# Patient Record
Sex: Male | Born: 1975 | Race: Asian | Hispanic: No | Marital: Married | Smoking: Current every day smoker
Health system: Southern US, Community
[De-identification: ages and names within clinical notes are randomized; demographics above are authoritative.]

## PROBLEM LIST (undated history)

## (undated) DIAGNOSIS — F32A Depression, unspecified: Secondary | ICD-10-CM

## (undated) DIAGNOSIS — I10 Essential (primary) hypertension: Secondary | ICD-10-CM

## (undated) DIAGNOSIS — Z8673 Personal history of transient ischemic attack (TIA), and cerebral infarction without residual deficits: Secondary | ICD-10-CM

## (undated) HISTORY — DX: Personal history of transient ischemic attack (TIA), and cerebral infarction without residual deficits: Z86.73

## (undated) HISTORY — DX: Depression, unspecified: F32.A

---

## 2010-05-29 ENCOUNTER — Emergency Department (HOSPITAL_COMMUNITY)
Admission: EM | Admit: 2010-05-29 | Discharge: 2010-05-30 | Disposition: A | Discharge status: Home / Self Care | Payer: Self-pay | Attending: Emergency Medicine | Admitting: Emergency Medicine

## 2010-05-29 DIAGNOSIS — K089 Disorder of teeth and supporting structures, unspecified: Secondary | ICD-10-CM | POA: Insufficient documentation

## 2010-05-29 DIAGNOSIS — I10 Essential (primary) hypertension: Secondary | ICD-10-CM | POA: Insufficient documentation

## 2010-05-30 LAB — POCT I-STAT, CHEM 8
Calcium, Ion: 1.05 mmol/L — ABNORMAL LOW (ref 1.12–1.32)
Chloride: 101 mEq/L (ref 96–112)
Creatinine, Ser: 1.2 mg/dL (ref 0.4–1.5)
Glucose, Bld: 87 mg/dL (ref 70–99)
Potassium: 3.6 mEq/L (ref 3.5–5.1)

## 2019-04-26 DIAGNOSIS — Z8673 Personal history of transient ischemic attack (TIA), and cerebral infarction without residual deficits: Secondary | ICD-10-CM

## 2019-04-26 HISTORY — DX: Personal history of transient ischemic attack (TIA), and cerebral infarction without residual deficits: Z86.73

## 2019-05-07 ENCOUNTER — Other Ambulatory Visit: Payer: Self-pay

## 2019-05-07 ENCOUNTER — Encounter (HOSPITAL_COMMUNITY): Payer: Self-pay

## 2019-05-07 ENCOUNTER — Emergency Department (HOSPITAL_COMMUNITY): Payer: Self-pay

## 2019-05-07 ENCOUNTER — Emergency Department (HOSPITAL_COMMUNITY)
Admission: EM | Admit: 2019-05-07 | Discharge: 2019-05-08 | Disposition: A | Discharge status: Home / Self Care | Payer: Self-pay | Attending: Emergency Medicine | Admitting: Emergency Medicine

## 2019-05-07 DIAGNOSIS — R200 Anesthesia of skin: Secondary | ICD-10-CM | POA: Insufficient documentation

## 2019-05-07 DIAGNOSIS — I10 Essential (primary) hypertension: Secondary | ICD-10-CM | POA: Insufficient documentation

## 2019-05-07 LAB — CBC
HCT: 54.3 % — ABNORMAL HIGH (ref 39.0–52.0)
Hemoglobin: 18.1 g/dL — ABNORMAL HIGH (ref 13.0–17.0)
MCH: 30.4 pg (ref 26.0–34.0)
MCHC: 33.3 g/dL (ref 30.0–36.0)
MCV: 91.1 fL (ref 80.0–100.0)
Platelets: 334 10*3/uL (ref 150–400)
RBC: 5.96 MIL/uL — ABNORMAL HIGH (ref 4.22–5.81)
RDW: 12.9 % (ref 11.5–15.5)
WBC: 9.8 10*3/uL (ref 4.0–10.5)
nRBC: 0 % (ref 0.0–0.2)

## 2019-05-07 LAB — BASIC METABOLIC PANEL
Anion gap: 10 (ref 5–15)
BUN: 13 mg/dL (ref 6–20)
CO2: 26 mmol/L (ref 22–32)
Calcium: 9.3 mg/dL (ref 8.9–10.3)
Chloride: 103 mmol/L (ref 98–111)
Creatinine, Ser: 0.81 mg/dL (ref 0.61–1.24)
GFR calc Af Amer: >60 mL/min (ref >60)
GFR calc non Af Amer: >60 mL/min (ref >60)
Glucose, Bld: 105 mg/dL — ABNORMAL HIGH (ref 70–99)
Potassium: 3.8 mmol/L (ref 3.5–5.1)
Sodium: 139 mmol/L (ref 135–145)

## 2019-05-07 LAB — TROPONIN I (HIGH SENSITIVITY): Troponin I (High Sensitivity): 17 ng/L (ref <18)

## 2019-05-07 MED ORDER — LABETALOL HCL 5 MG/ML IV SOLN
20.0000 mg | Freq: Once | INTRAVENOUS | Status: AC
  Administered 2019-05-07: 23:00:00 10 mg via INTRAVENOUS
  Filled 2019-05-07: qty 4

## 2019-05-07 MED ORDER — ACETAMINOPHEN 500 MG PO TABS
1000.0000 mg | ORAL_TABLET | Freq: Once | ORAL | Status: AC
  Administered 2019-05-07: 23:00:00 1000 mg via ORAL
  Filled 2019-05-07: qty 2

## 2019-05-07 NOTE — Discharge Instructions (Addendum)
There is an area seen on your chest x-ray that is not well identified. Recommend a chest CT in the outpatient setting for further evaluation. This is not likely contributing to your elevated blood pressure.   The Head CT does not show any new findings but does show old infarcts. Recommend an MRI in the outpatient setting. Please discuss this with your primary care doctor.   Take Norvasc every morning for treatment of blood pressure. Measure your blood pressure in the morning and at night until your appointment with primary care and keep a journal of measurements to take with you to that appointment.   If you develop any new symptoms of concern, please do not hesitate to return to the emergency department for further evaluation.

## 2019-05-07 NOTE — ED Triage Notes (Signed)
Patient arrived with complaints of hypertension. Reporting his blood pressure has been high a few days. Also stating he has some numbness in his right arm and leg that has been present for a few weeks.

## 2019-05-07 NOTE — ED Notes (Signed)
Other 10mg  given as directed by PA Upstill.

## 2019-05-07 NOTE — ED Notes (Signed)
PT transported to CT>

## 2019-05-07 NOTE — ED Notes (Signed)
Patient called for EKG with no answer from lobby.

## 2019-05-07 NOTE — ED Provider Notes (Signed)
[facility] DEPT Provider Note   CSN: [id number] Arrival date & time: 05/07/19  1854     History Chief Complaint  Patient presents with  . Hypertension    Victor Davies is a 44 y.o. male.  Patient to ED with concern for elevated blood pressure. He reports taking his measurements at home for the past 2-3 days and found it high. Prior to this, he cannot remember the last time it was measured. He reports a decreased sensation to right arm and leg for the past 2 weeks. No trouble walking. He reports writing is difficult (he is right hand dominant). No chest pain, breathing problems, nausea. He currently has a headache described as bilateral, frontal, throbbing. No visual changes. He does not know if his parents had HTN. He is a smoker.   The history is provided by the patient. No language interpreter was used.  Hypertension Associated symptoms include headaches. Pertinent negatives include no chest pain, no abdominal pain and no shortness of breath.       History reviewed. No pertinent past medical history.  There are no problems to display for this patient.   History reviewed. No pertinent surgical history.     No family history on file.  Social History   Tobacco Use  . Smoking status: Not on file  Substance Use Topics  . Alcohol use: Not on file  . Drug use: Not on file    Home Medications Prior to Admission medications   Not on File    Allergies    Patient has no known allergies.  Review of Systems   Review of Systems  Constitutional: Negative for chills and fever.  HENT: Negative.   Eyes: Negative for visual disturbance.  Respiratory: Negative.  Negative for shortness of breath.   Cardiovascular: Negative.  Negative for chest pain and leg swelling.  Gastrointestinal: Negative.  Negative for abdominal pain, nausea and vomiting.  Musculoskeletal: Negative.   Skin: Negative.   Allergic/Immunologic: Negative for  immunocompromised state.  Neurological: Positive for numbness and headaches. Negative for speech difficulty and light-headedness.    Physical Exam Updated Vital Signs BP (!) 236/144 (BP Location: Left Arm)   Pulse 81   Temp 98.6 F (37 C) (Oral)   Resp 20   Ht 5\' 9"  (1.753 m)   Wt 69.4 kg   SpO2 100%   BMI 22.59 kg/m   Physical Exam Vitals and nursing note reviewed.  Constitutional:      Appearance: Normal appearance. He is well-developed. He is not ill-appearing.     Comments: Appears anxious.  HENT:     Head: Normocephalic.  Eyes:     General: No visual field deficit.    Extraocular Movements: Extraocular movements intact.     Pupils: Pupils are equal, round, and reactive to light.  Neck:     Vascular: No carotid bruit.  Cardiovascular:     Rate and Rhythm: Normal rate and regular rhythm.     Heart sounds: No murmur.  Pulmonary:     Effort: Pulmonary effort is normal.     Breath sounds: Normal breath sounds. No wheezing, rhonchi or rales.  Abdominal:     General: Bowel sounds are normal.     Palpations: Abdomen is soft.     Tenderness: There is no abdominal tenderness. There is no guarding or rebound.  Musculoskeletal:        General: Normal range of motion.     Cervical back: Normal range of motion and  neck supple.  Skin:    General: Skin is warm and dry.     Findings: No rash.  Neurological:     General: No focal deficit present.     Mental Status: He is alert.     GCS: GCS eye subscore is 4. GCS verbal subscore is 5. GCS motor subscore is 6.     Cranial Nerves: No dysarthria or facial asymmetry.     Sensory: Sensation is intact. No sensory deficit.     Motor: No weakness or pronator drift.     Coordination: Finger-Nose-Finger Test normal.     Gait: Gait is intact.     Comments: CN's 3-12 intact.     ED Results / Procedures / Treatments   Labs (all labs ordered are listed, but only abnormal results are displayed) Labs Reviewed  CBC - Abnormal;  Notable for the following components:      Result Value   RBC 5.96 (*)    Hemoglobin 18.1 (*)    HCT 54.3 (*)    All other components within normal limits  BASIC METABOLIC PANEL  TROPONIN I (HIGH SENSITIVITY)    EKG None  Radiology DG Chest 2 View  Result Date: 05/07/2019 CLINICAL DATA:  Chest numbness EXAM: CHEST - 2 VIEW COMPARISON:  None. FINDINGS: No acute consolidation or effusion. Oval opacity projecting over the spine on lateral view. Heart size upper normal. No pneumothorax. IMPRESSION: No active cardiopulmonary disease. Possible oval opacity projecting over the spine on lateral view. Lung parenchymal abnormality cannot be excluded. Chest CT is suggested for further evaluation. Electronically Signed   By: Jasmine Pang M.D.   On: 05/07/2019 21:45    Procedures Procedures (including critical care time)  Medications Ordered in ED Medications  acetaminophen (TYLENOL) tablet 1,000 mg (has no administration in time range)  labetalol (NORMODYNE) injection 20 mg (has no administration in time range)    ED Course  I have reviewed the triage vital signs and the nursing notes.  Pertinent labs & imaging results that were available during my care of the patient were reviewed by me and considered in my medical decision making (see chart for details).    MDM Rules/Calculators/A&P                      Patient to ED with significantly elevated blood pressure. No CP, SOB, nausea. Does c/o right extremity numbness but no deficits on neuro exam.   Labs pending. Will obtain CT. CXR w/o infiltrates. There is an opacity in the right next to the spine of questionable significance. Recommendation is for outpatient chest CT to clarify. This was discussed with the patient and wife.   11:10 - Blood pressure 192/128 after Labetalol 10 mg. Will give additional 10 mg.  1:00 - 189/122 - CT shows old infarcts but no acute changes. Recommend MRI which can be done in the outpatient setting. This was  discussed with the patient and wife.   Patient care planning discussed with Dr. Judd Lien. He can be discharged home with referral for primary care provider. Will start on Norvasc 10 mg daily. The patient and wife are comfortable with plan of discharge.   Final Clinical Impression(s) / ED Diagnoses Final diagnoses:  None   1. Hypertension  Rx / DC Orders ED Discharge Orders    None       Elpidio Anis, PA-C 05/08/19 0125    Geoffery Lyons, MD 05/08/19 1504

## 2019-05-08 LAB — TROPONIN I (HIGH SENSITIVITY): Troponin I (High Sensitivity): 19 ng/L — ABNORMAL HIGH (ref <18)

## 2019-05-08 MED ORDER — AMLODIPINE BESYLATE 10 MG PO TABS: 10.0000 mg | ORAL_TABLET | Freq: Every day | ORAL | 0 refills | Status: DC

## 2019-05-08 NOTE — ED Notes (Signed)
PT educated on importance of taking blood pressure medications and follow up CT and MRI. PT verbalized understanding. PT educated to return if exprince dizziness or blurred vision.

## 2019-05-10 ENCOUNTER — Other Ambulatory Visit: Payer: Self-pay | Admitting: Internal Medicine

## 2019-05-10 ENCOUNTER — Encounter: Payer: Self-pay | Admitting: Internal Medicine

## 2019-05-10 ENCOUNTER — Other Ambulatory Visit: Payer: Self-pay

## 2019-05-10 ENCOUNTER — Ambulatory Visit: Payer: Self-pay | Attending: Internal Medicine | Admitting: Internal Medicine

## 2019-05-10 VITALS — BP 171/110 | HR 81 | Temp 97.9°F | Resp 16 | Ht 69.0 in | Wt 162.4 lb

## 2019-05-10 DIAGNOSIS — D751 Secondary polycythemia: Secondary | ICD-10-CM

## 2019-05-10 DIAGNOSIS — F17200 Nicotine dependence, unspecified, uncomplicated: Secondary | ICD-10-CM

## 2019-05-10 DIAGNOSIS — R209 Unspecified disturbances of skin sensation: Secondary | ICD-10-CM

## 2019-05-10 DIAGNOSIS — I69398 Other sequelae of cerebral infarction: Secondary | ICD-10-CM

## 2019-05-10 DIAGNOSIS — I10 Essential (primary) hypertension: Secondary | ICD-10-CM

## 2019-05-10 DIAGNOSIS — R918 Other nonspecific abnormal finding of lung field: Secondary | ICD-10-CM

## 2019-05-10 MED ORDER — VARENICLINE TARTRATE 0.5 MG PO TABS: 0.5000 mg | ORAL_TABLET | Freq: Two times a day (BID) | ORAL | 0 refills | Status: DC

## 2019-05-10 MED ORDER — ASPIRIN EC 81 MG PO TBEC: 81.0000 mg | DELAYED_RELEASE_TABLET | Freq: Every day | ORAL | 0 refills | Status: DC

## 2019-05-10 MED ORDER — LISINOPRIL 10 MG PO TABS: 10.0000 mg | ORAL_TABLET | Freq: Every day | ORAL | 3 refills | Status: DC

## 2019-05-10 MED ORDER — AMLODIPINE BESYLATE 10 MG PO TABS: 10.0000 mg | ORAL_TABLET | Freq: Every day | ORAL | 3 refills | Status: DC

## 2019-05-10 MED FILL — AMLODIPINE BESYLATE 10 MG T: 10 | 30 days supply | Qty: 30 | Fill #0

## 2019-05-10 MED FILL — LISINOPRIL 10 MG TABS: 10 | 30 days supply | Qty: 30 | Fill #0

## 2019-05-10 NOTE — Progress Notes (Signed)
Patient ID: Victor Davies, male    DOB: 1975/12/30  MRN: [id number]  CC: Hospitalization Follow-up (ED)   Subjective: Victor Davies is a 44 y.o. male who presents for new pt visit and ER f/u.  Pt orginally from [place]. His concerns today include:  HTN  No previous PCP.   No previous chronic med issues until he did home BP readings several days ago. Marland Kitchen  NSAIDs follow-up from the emergency room where he was seen 3 days ago with significant elevation in both systolic and diastolic blood pressure.  He was checking blood pressure at home 2 to 3 days prior and found that it was elevated.  He also complained of decreased sensation in the right upper and lower extremity x2 weeks.  In the ER blood pressure was 236/144.  Neurologic exam is intact.  Chemistry revealed elevated hemoglobin on current hematocrit.  CT of the head revealed chronic small vessel disease greater than expected for age with multiple old small vessel infarcts.  MRI was recommended for further characterization.  Chest x-ray revealed a possible oval opacity projecting over the spine on lateral view.  Chest CT was recommended.  Patient was discharged home on Norvasc 10 mg daily.  Started Norvasc.  BP last night was 170/70-80.  Today he vomited after taking his Norvasc but he did not notice the pill in his emesis. No HA/dizziness, CP, SOB, LE edema Still having numbness in arm and leg x 1 mth.  Sometimes he feels the left leg is weak.   He smokes about a third a pack a day.  He has smoked for 20 years.  Reports chronic dry cough x2 years.  Past medical, social, family history reviewed and updated in the system.  No current outpatient medications on file prior to visit.   No current facility-administered medications on file prior to visit.    No Known Allergies  Social History   Socioeconomic History  . Marital status: Single    Spouse name: Not on file  . Number of children: 2  . Years of education: Not on file    . Highest education level: 9th grade  Occupational History  . Occupation: unemployed  Tobacco Use  . Smoking status: Current Some Day Smoker    Packs/day: 0.25    Years: 20.00    Pack years: 5.00  . Smokeless tobacco: Never Used  Substance and Sexual Activity  . Alcohol use: Yes    Alcohol/week: 14.0 - 21.0 standard drinks    Types: 14 - 21 Cans of beer per week  . Drug use: Not on file  . Sexual activity: Not on file  Other Topics Concern  . Not on file  Social History Narrative  . Not on file   Social Determinants of Health   Financial Resource Strain:   . Difficulty of Paying Living Expenses:   Food Insecurity:   . Worried About Programme researcher, broadcasting/film/video in the Last Year:   . Barista in the Last Year:   Transportation Needs:   . Freight forwarder (Medical):   Marland Kitchen Lack of Transportation (Non-Medical):   Physical Activity:   . Days of Exercise per Week:   . Minutes of Exercise per Session:   Stress:   . Feeling of Stress :   Social Connections:   . Frequency of Communication with Friends and Family:   . Frequency of Social Gatherings with Friends and Family:   . Attends Religious Services:   .  Active Member of Clubs or Organizations:   . Attends Banker Meetings:   Marland Kitchen Marital Status:   Intimate Partner Violence:   . Fear of Current or Ex-Partner:   . Emotionally Abused:   Marland Kitchen Physically Abused:   . Sexually Abused:     Family History  Problem Relation Age of Onset  . Hypertension Father     No past surgical history on file.  ROS: Review of Systems Negative except as stated above  PHYSICAL EXAM: BP (!) 171/110   Pulse 81   Temp 97.9 F (36.6 C)   Resp 16   Ht 5\' 9"  (1.753 m)   Wt 162 lb 6.4 oz (73.7 kg)   SpO2 98%   BMI 23.98 kg/m   BP 170/120 Physical Exam  General appearance - alert, well appearing, middle-age male and in no distress Mental status - alert, oriented to person, place, and time, normal mood, behavior, speech,  dress, motor activity, and thought processes Eyes - pupils equal and reactive, extraocular eye movements intact Mouth - mucous membranes moist, pharynx normal without lesions Neck - supple, no significant adenopathy Chest - clear to auscultation, no wheezes, rales or rhonchi, symmetric air entry Heart - normal rate, regular rhythm, normal S1, S2, no murmurs, rubs, clicks or gallops Neurological - cranial nerves II through XII intact, normal muscle tone, no tremors, strength 5/5.  Decreased gross sensation in both the right upper and lower extremity.  Gait is okay Extremities -no lower extremity edema CMP Latest Ref Rng & Units 05/07/2019 05/30/2010  Glucose 70 - 99 mg/dL 07/30/2010) 87  BUN 6 - 20 mg/dL 13 7  Creatinine 782(N - 1.24 mg/dL 5.62 1.30  Sodium 8.65 - 145 mmol/L 139 135  Potassium 3.5 - 5.1 mmol/L 3.8 3.6  Chloride 98 - 111 mmol/L 103 101  CO2 22 - 32 mmol/L 26 -  Calcium 8.9 - 10.3 mg/dL 9.3 -   Lipid Panel  No results found for: CHOL, TRIG, HDL, CHOLHDL, VLDL, LDLCALC, LDLDIRECT  CBC    Component Value Date/Time   WBC 9.8 05/07/2019 2211   RBC 5.96 (H) 05/07/2019 2211   HGB 18.1 (H) 05/07/2019 2211   HCT 54.3 (H) 05/07/2019 2211   PLT 334 05/07/2019 2211   MCV 91.1 05/07/2019 2211   MCH 30.4 05/07/2019 2211   MCHC 33.3 05/07/2019 2211   RDW 12.9 05/07/2019 2211    ASSESSMENT AND PLAN: 1. Essential hypertension Not at goal.  Add lisinopril 10 mg.  Continue Norvasc.  Encourage low salt intake. - Hepatic Function Panel - lisinopril (ZESTRIL) 10 MG tablet; Take 1 tablet (10 mg total) by mouth daily.  Dispense: 30 tablet; Refill: 3 - amLODipine (NORVASC) 10 MG tablet; Take 1 tablet (10 mg total) by mouth daily.  Dispense: 30 tablet; Refill: 3  2. Tobacco dependence Strongly advised to discontinue smoking.  Discussed health risks associated with smoking.  He would like to quit.  Discussed methods to help him quit.  He would like to try the Chantix.  I went over possible side  effects of the medication including mood swings and bad dreams. - varenicline (CHANTIX) 0.5 MG tablet; Take 1 tablet (0.5 mg total) by mouth 2 (two) times daily. For smoking cessation  Dispense: 30 tablet; Refill: 0  3. CVA, old, alterations of sensations Looks like he has small old infarcts on CAT scan.  Discussed with patient the importance of secondary prevention including good blood pressure control, smoking cessation and control of cholesterol.  We will check a lipid profile today.  Add low-dose aspirin. -He will need further work-up including an MRI of the head, carotid ultrasound and echo.  He is uninsured.  I have told him that we can order these things for which he would end up having to pay out-of-pocket or he can try applying for the orange card/cone discount card as quickly as possible.  He prefers to do the latter. - Lipid panel - aspirin EC 81 MG tablet; Take 1 tablet (81 mg total) by mouth daily.  Dispense: 100 tablet; Refill: 0  4. Opacity of lung on imaging study He will need a CAT scan of the chest to evaluate.  We will order once he has the orange card/cone discount.  Advised to quit smoking.  5. Polycythemia Likely due to smoking.  I forgot to ask him about any symptoms of sleep apnea.  I will do that on his next visit.  We can also check erythropoietin level    Patient was given the opportunity to ask questions.  Patient verbalized understanding of the plan and was able to repeat key elements of the plan.   Orders Placed This Encounter  Procedures  . Lipid panel  . Hepatic Function Panel     Requested Prescriptions   Signed Prescriptions Disp Refills  . lisinopril (ZESTRIL) 10 MG tablet 30 tablet 3    Sig: Take 1 tablet (10 mg total) by mouth daily.  Marland Kitchen amLODipine (NORVASC) 10 MG tablet 30 tablet 3    Sig: Take 1 tablet (10 mg total) by mouth daily.  . varenicline (CHANTIX) 0.5 MG tablet 30 tablet 0    Sig: Take 1 tablet (0.5 mg total) by mouth 2 (two) times  daily. For smoking cessation  . aspirin EC 81 MG tablet 100 tablet 0    Sig: Take 1 tablet (81 mg total) by mouth daily.    Return in about 1 week (around 05/17/2019).  Karle Plumber, MD, FACP

## 2019-05-10 NOTE — Progress Notes (Signed)
Pt states he took his bp medicine this morning and threw up  Pt states his right arm and hand is numb

## 2019-05-10 NOTE — Patient Instructions (Addendum)
Please get form for the orange card/cone discount card today on your way out.  Please fill those out and bring the supporting documents with the completed form so that we can schedule an appointment for you to see our financial specialist.  Continue amlodipine for blood pressure. We have added another blood pressure medicine called lisinopril 10 mg daily.  You need to start taking a baby aspirin 81 mg daily to help prevent future episodes of stroke.  Please work on trying to quit smoking.  We have started you on a medication called Chantix to assist with this. Try to cut back on the amount of beers that you drink to no more than 1 a day.

## 2019-05-11 ENCOUNTER — Telehealth: Payer: Self-pay

## 2019-05-11 ENCOUNTER — Other Ambulatory Visit: Payer: Self-pay | Admitting: Internal Medicine

## 2019-05-11 LAB — HEPATIC FUNCTION PANEL
ALT: 14 IU/L (ref 0–44)
AST: 21 IU/L (ref 0–40)
Albumin: 4.8 g/dL (ref 4.0–5.0)
Alkaline Phosphatase: 81 IU/L (ref 39–117)
Bilirubin Total: 0.8 mg/dL (ref 0.0–1.2)
Bilirubin, Direct: 0.2 mg/dL (ref 0.00–0.40)
Total Protein: 7.6 g/dL (ref 6.0–8.5)

## 2019-05-11 LAB — LIPID PANEL
Chol/HDL Ratio: 3.6 ratio (ref 0.0–5.0)
Cholesterol, Total: 247 mg/dL — ABNORMAL HIGH (ref 100–199)
HDL: 69 mg/dL (ref >39)
LDL Chol Calc (NIH): 166 mg/dL — ABNORMAL HIGH (ref 0–99)
Triglycerides: 72 mg/dL (ref 0–149)
VLDL Cholesterol Cal: 12 mg/dL (ref 5–40)

## 2019-05-11 MED ORDER — ATORVASTATIN CALCIUM 20 MG PO TABS: 20.0000 mg | ORAL_TABLET | Freq: Every day | ORAL | 3 refills | Status: DC

## 2019-05-11 MED FILL — ATORVASTATIN CALCIUM 20 MG: 20 | 30 days supply | Qty: 30 | Fill #0

## 2019-05-11 NOTE — Telephone Encounter (Signed)
Contacted pt to go over lab results pt is aware and doesn't have any questions or concerns 

## 2019-05-11 NOTE — Progress Notes (Signed)
Let patient know that his cholesterol is elevated.  I recommend starting a medication to help lower cholesterol called atorvastatin.  I have sent this prescription to our pharmacy.  Liver function tests normal.

## 2019-05-16 ENCOUNTER — Emergency Department (HOSPITAL_COMMUNITY): Payer: Medicaid Other

## 2019-05-16 ENCOUNTER — Other Ambulatory Visit: Payer: Self-pay

## 2019-05-16 ENCOUNTER — Encounter: Payer: Self-pay | Admitting: Internal Medicine

## 2019-05-16 ENCOUNTER — Encounter (HOSPITAL_COMMUNITY): Payer: Self-pay | Admitting: Emergency Medicine

## 2019-05-16 ENCOUNTER — Inpatient Hospital Stay (HOSPITAL_COMMUNITY)
Admission: EM | Admit: 2019-05-16 | Discharge: 2019-05-19 | DRG: 065 | Disposition: A | Discharge status: Home / Self Care | Payer: Medicaid Other | Attending: Internal Medicine | Admitting: Internal Medicine

## 2019-05-16 DIAGNOSIS — I10 Essential (primary) hypertension: Secondary | ICD-10-CM | POA: Diagnosis present

## 2019-05-16 DIAGNOSIS — E876 Hypokalemia: Secondary | ICD-10-CM

## 2019-05-16 DIAGNOSIS — R918 Other nonspecific abnormal finding of lung field: Secondary | ICD-10-CM | POA: Diagnosis present

## 2019-05-16 DIAGNOSIS — Z720 Tobacco use: Secondary | ICD-10-CM

## 2019-05-16 DIAGNOSIS — I161 Hypertensive emergency: Secondary | ICD-10-CM | POA: Diagnosis present

## 2019-05-16 DIAGNOSIS — R9431 Abnormal electrocardiogram [ECG] [EKG]: Secondary | ICD-10-CM | POA: Diagnosis not present

## 2019-05-16 DIAGNOSIS — F1021 Alcohol dependence, in remission: Secondary | ICD-10-CM

## 2019-05-16 DIAGNOSIS — I2510 Atherosclerotic heart disease of native coronary artery without angina pectoris: Secondary | ICD-10-CM | POA: Diagnosis present

## 2019-05-16 DIAGNOSIS — E785 Hyperlipidemia, unspecified: Secondary | ICD-10-CM | POA: Diagnosis present

## 2019-05-16 DIAGNOSIS — F1210 Cannabis abuse, uncomplicated: Secondary | ICD-10-CM

## 2019-05-16 DIAGNOSIS — I6381 Other cerebral infarction due to occlusion or stenosis of small artery: Principal | ICD-10-CM | POA: Diagnosis present

## 2019-05-16 DIAGNOSIS — Z8261 Family history of arthritis: Secondary | ICD-10-CM

## 2019-05-16 DIAGNOSIS — R29702 NIHSS score 2: Secondary | ICD-10-CM | POA: Diagnosis present

## 2019-05-16 DIAGNOSIS — Z7982 Long term (current) use of aspirin: Secondary | ICD-10-CM

## 2019-05-16 DIAGNOSIS — I639 Cerebral infarction, unspecified: Secondary | ICD-10-CM | POA: Diagnosis present

## 2019-05-16 DIAGNOSIS — R42 Dizziness and giddiness: Secondary | ICD-10-CM

## 2019-05-16 DIAGNOSIS — R202 Paresthesia of skin: Secondary | ICD-10-CM | POA: Diagnosis present

## 2019-05-16 DIAGNOSIS — R269 Unspecified abnormalities of gait and mobility: Secondary | ICD-10-CM | POA: Diagnosis present

## 2019-05-16 DIAGNOSIS — F1010 Alcohol abuse, uncomplicated: Secondary | ICD-10-CM | POA: Diagnosis present

## 2019-05-16 DIAGNOSIS — Z87891 Personal history of nicotine dependence: Secondary | ICD-10-CM

## 2019-05-16 DIAGNOSIS — Z20822 Contact with and (suspected) exposure to covid-19: Secondary | ICD-10-CM | POA: Diagnosis present

## 2019-05-16 DIAGNOSIS — E7800 Pure hypercholesterolemia, unspecified: Secondary | ICD-10-CM | POA: Diagnosis present

## 2019-05-16 DIAGNOSIS — R112 Nausea with vomiting, unspecified: Secondary | ICD-10-CM | POA: Diagnosis present

## 2019-05-16 DIAGNOSIS — Z8249 Family history of ischemic heart disease and other diseases of the circulatory system: Secondary | ICD-10-CM

## 2019-05-16 DIAGNOSIS — Z79899 Other long term (current) drug therapy: Secondary | ICD-10-CM

## 2019-05-16 DIAGNOSIS — R911 Solitary pulmonary nodule: Secondary | ICD-10-CM | POA: Diagnosis present

## 2019-05-16 HISTORY — DX: Essential (primary) hypertension: I10

## 2019-05-16 LAB — COMPREHENSIVE METABOLIC PANEL
ALT: 22 U/L (ref 0–44)
AST: 19 U/L (ref 15–41)
Albumin: 4.3 g/dL (ref 3.5–5.0)
Alkaline Phosphatase: 67 U/L (ref 38–126)
Anion gap: 11 (ref 5–15)
BUN: 13 mg/dL (ref 6–20)
CO2: 26 mmol/L (ref 22–32)
Calcium: 9.3 mg/dL (ref 8.9–10.3)
Chloride: 103 mmol/L (ref 98–111)
Creatinine, Ser: 0.85 mg/dL (ref 0.61–1.24)
GFR calc Af Amer: >60 mL/min (ref >60)
GFR calc non Af Amer: >60 mL/min (ref >60)
Glucose, Bld: 116 mg/dL — ABNORMAL HIGH (ref 70–99)
Potassium: 4.2 mmol/L (ref 3.5–5.1)
Sodium: 140 mmol/L (ref 135–145)
Total Bilirubin: 1 mg/dL (ref 0.3–1.2)
Total Protein: 8 g/dL (ref 6.5–8.1)

## 2019-05-16 LAB — CBC
HCT: 53.2 % — ABNORMAL HIGH (ref 39.0–52.0)
Hemoglobin: 17.5 g/dL — ABNORMAL HIGH (ref 13.0–17.0)
MCH: 30.7 pg (ref 26.0–34.0)
MCHC: 32.9 g/dL (ref 30.0–36.0)
MCV: 93.3 fL (ref 80.0–100.0)
Platelets: 342 10*3/uL (ref 150–400)
RBC: 5.7 MIL/uL (ref 4.22–5.81)
RDW: 12.8 % (ref 11.5–15.5)
WBC: 10.6 10*3/uL — ABNORMAL HIGH (ref 4.0–10.5)
nRBC: 0 % (ref 0.0–0.2)

## 2019-05-16 LAB — URINALYSIS, ROUTINE W REFLEX MICROSCOPIC
Bilirubin Urine: NEGATIVE
Glucose, UA: 150 mg/dL — AB
Hgb urine dipstick: NEGATIVE
Ketones, ur: 20 mg/dL — AB
Leukocytes,Ua: NEGATIVE
Nitrite: NEGATIVE
Protein, ur: NEGATIVE mg/dL
Specific Gravity, Urine: 1.015 (ref 1.005–1.030)
pH: 6 (ref 5.0–8.0)

## 2019-05-16 LAB — LIPASE, BLOOD: Lipase: 22 U/L (ref 11–51)

## 2019-05-16 MED ORDER — SODIUM CHLORIDE 0.9% FLUSH
3.0000 mL | Freq: Once | INTRAVENOUS | Status: AC
  Administered 2019-05-16: 3 mL via INTRAVENOUS

## 2019-05-16 MED ORDER — PROCHLORPERAZINE EDISYLATE 10 MG/2ML IJ SOLN
10.0000 mg | Freq: Once | INTRAMUSCULAR | Status: AC
  Administered 2019-05-16: 10 mg via INTRAVENOUS
  Filled 2019-05-16: qty 2

## 2019-05-16 MED ORDER — SODIUM CHLORIDE 0.9 % IV BOLUS
1000.0000 mL | Freq: Once | INTRAVENOUS | Status: AC
  Administered 2019-05-16: 1000 mL via INTRAVENOUS

## 2019-05-16 MED ORDER — MECLIZINE HCL 25 MG PO TABS
25.0000 mg | ORAL_TABLET | Freq: Once | ORAL | Status: AC
  Administered 2019-05-16: 21:00:00 25 mg via ORAL
  Filled 2019-05-16: qty 1

## 2019-05-16 NOTE — ED Notes (Signed)
Patient made aware urine sample is needed x 2.

## 2019-05-16 NOTE — ED Notes (Signed)
Pt given a urinal and denies being able to give urine sample at this time

## 2019-05-16 NOTE — ED Triage Notes (Signed)
Pt c/o n/v and dizziness that started this morning.

## 2019-05-16 NOTE — ED Notes (Signed)
Patient transported to MRI 

## 2019-05-16 NOTE — ED Provider Notes (Signed)
[facility]-EMERGENCY DEPT Provider Note   CSN: [id number] Arrival date & time: 05/16/19  1759     History Chief Complaint  Patient presents with  . Dizziness  . Emesis    Victor Davies is a 44 y.o. male.  The history is provided by the patient.  Dizziness Quality:  Lightheadedness and imbalance Severity:  Moderate Onset quality:  Gradual Timing:  Constant Progression:  Unchanged Chronicity:  New Context: head movement and standing up   Relieved by:  Being still Worsened by:  Movement Associated symptoms: vomiting   Associated symptoms: no blood in stool, no chest pain, no diarrhea, no headaches, no hearing loss, no nausea, no palpitations, no shortness of breath, no syncope, no tinnitus, no vision changes and no weakness   Risk factors: no hx of stroke and no hx of vertigo        History reviewed. No pertinent past medical history.  Patient Active Problem List   Diagnosis Date Noted  . Essential hypertension 05/10/2019  . Tobacco dependence 05/10/2019  . 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 Opacity of lung on imaging study 05/10/2019  . Polycythemia 05/10/2019    History reviewed. No pertinent surgical history.     Family History  Problem Relation Age of Onset  . Hypertension Father     Social History   Tobacco Use  . Smoking status: Current Some Day Smoker    Packs/day: 0.25    Years: 20.00    Pack years: 5.00  . Smokeless tobacco: Never Used  Substance Use Topics  . Alcohol use: Yes    Alcohol/week: 14.0 - 21.0 standard drinks    Types: 14 - 21 Cans of beer per week  . Drug use: Not on file    Home Medications Prior to Admission medications   Medication Sig Start Date End Date Taking? Authorizing Provider  amLODipine (NORVASC) 10 MG tablet Take 1 tablet (10 mg total) by mouth daily. 05/10/19  Yes Marcine Matar, MD  aspirin EC 81 MG tablet Take 1 tablet (81 mg total) by mouth daily. 05/10/19  Yes  Marcine Matar, MD  atorvastatin (LIPITOR) 20 MG tablet Take 1 tablet (20 mg total) by mouth daily. 05/11/19  Yes Marcine Matar, MD  lisinopril (ZESTRIL) 10 MG tablet Take 1 tablet (10 mg total) by mouth daily. 05/10/19  Yes Marcine Matar, MD  varenicline (CHANTIX) 0.5 MG tablet Take 1 tablet (0.5 mg total) by mouth 2 (two) times daily. For smoking cessation 05/10/19   Marcine Matar, MD    Allergies    Patient has no known allergies.  Review of Systems   Review of Systems  Constitutional: Negative for chills and fever.  HENT: Negative for ear pain, hearing loss, sore throat and tinnitus.   Eyes: Negative for pain and visual disturbance.  Respiratory: Negative for cough and shortness of breath.   Cardiovascular: Negative for chest pain, palpitations and syncope.  Gastrointestinal: Positive for vomiting. Negative for abdominal pain, blood in stool, diarrhea and nausea.  Genitourinary: Negative for dysuria and hematuria.  Musculoskeletal: Negative for arthralgias and back pain.  Skin: Negative for color change and rash.  Neurological: Positive for dizziness. Negative for seizures, syncope, weakness and headaches.  All other systems reviewed and are negative.   Physical Exam Updated Vital Signs  ED Triage Vitals  Enc Vitals Group     BP 05/16/19 1813 (!) 174/111     Pulse Rate 05/16/19 1813 72  Resp 05/16/19 1813 17     Temp 05/16/19 1813 97.6 F (36.4 C)     Temp Source 05/16/19 1813 Oral     SpO2 05/16/19 1813 99 %     Weight --      Height --      Head Circumference --      Peak Flow --      Pain Score 05/16/19 1811 0     Pain Loc --      Pain Edu? --      Excl. in GC? --     Physical Exam Vitals and nursing note reviewed.  Constitutional:      General: He is not in acute distress.    Appearance: He is well-developed. He is not ill-appearing.  HENT:     Head: Normocephalic and atraumatic.     Mouth/Throat:     Mouth: Mucous membranes are dry.    Eyes:     Extraocular Movements: Extraocular movements intact.     Conjunctiva/sclera: Conjunctivae normal.     Pupils: Pupils are equal, round, and reactive to light.  Cardiovascular:     Rate and Rhythm: Normal rate and regular rhythm.     Pulses: Normal pulses.     Heart sounds: Normal heart sounds. No murmur.  Pulmonary:     Effort: Pulmonary effort is normal. No respiratory distress.     Breath sounds: Normal breath sounds.  Abdominal:     Palpations: Abdomen is soft.     Tenderness: There is no abdominal tenderness.  Musculoskeletal:     Cervical back: Neck supple.  Skin:    General: Skin is warm and dry.  Neurological:     General: No focal deficit present.     Mental Status: He is alert and oriented to person, place, and time.     Cranial Nerves: No cranial nerve deficit.     Sensory: No sensory deficit.     Motor: No weakness.     Coordination: Coordination normal.     Gait: Gait abnormal.     Comments: Patient unable to walk due to severe symptoms, otherwise 5+ out of 5 strength throughout, normal sensation, no drift, normal finger-nose-finger     ED Results / Procedures / Treatments   Labs (all labs ordered are listed, but only abnormal results are displayed) Labs Reviewed  COMPREHENSIVE METABOLIC PANEL - Abnormal; Notable for the following components:      Result Value   Glucose, Bld 116 (*)    All other components within normal limits  CBC - Abnormal; Notable for the following components:   WBC 10.6 (*)    Hemoglobin 17.5 (*)    HCT 53.2 (*)    All other components within normal limits  LIPASE, BLOOD  URINALYSIS, ROUTINE W REFLEX MICROSCOPIC    EKG None  Radiology No results found.  Procedures Procedures (including critical care time)  Medications Ordered in ED Medications  sodium chloride flush (NS) 0.9 % injection 3 mL (3 mLs Intravenous Given 05/16/19 2002)  meclizine (ANTIVERT) tablet 25 mg (25 mg Oral Given 05/16/19 2045)  sodium chloride  0.9 % bolus 1,000 mL (1,000 mLs Intravenous New Bag/Given 05/16/19 2045)  prochlorperazine (COMPAZINE) injection 10 mg (10 mg Intravenous Given 05/16/19 2044)    ED Course  I have reviewed the triage vital signs and the nursing notes.  Pertinent labs & imaging results that were available during my care of the patient were reviewed by me and considered in my medical  decision making (see chart for details).    MDM Rules/Calculators/A&P                       Venkat Canino is a 44 year old male with history of hypertension who presents to the ED with dizziness, emesis.  Patient with mild hypertension upon arrival but otherwise normal vitals.  Symptoms started today and have been progressive.  Has fairly persistent dizziness with nausea.  Had recent work-up that showed he might of had old small strokes in the past.  Was supposed to get an MRI outpatient.  Blood pressure has been uncontrolled for a while.  Recently saw primary care doctor who has started patient on lisinopril and amlodipine.  Patient overall appears to have peripheral vertigo but he is very symptomatic and unable to walk.  We will get a CT of his head as well as MRI to rule out stroke given his chronic hypertension history and changes on his last head CT.  Low suspicion for head bleed.  Will check basic blood work.  Lab work overall unremarkable.  Patient slightly improved following meclizine, Compazine.  Patient went quickly to MRI and therefore head CT was held.  Upon my review of the MRI there is no obvious large head bleed.  Suspect stroke versus peripheral vertigo.  Patient signed out to oncoming ED staff Dr. Sedonia Small with MRI pending and final reevaluation.  Please see his note for further results, evaluation, disposition of patient.  This chart was dictated using voice recognition software.  Despite best efforts to proofread,  errors can occur which can change the documentation meaning.    Final Clinical Impression(s) / ED  Diagnoses Final diagnoses:  Dizziness    Rx / DC Orders ED Discharge Orders    None       Lennice Sites, DO 05/16/19 2349

## 2019-05-17 ENCOUNTER — Other Ambulatory Visit (HOSPITAL_COMMUNITY): Payer: Medicaid Other

## 2019-05-17 ENCOUNTER — Ambulatory Visit: Payer: Self-pay | Admitting: Internal Medicine

## 2019-05-17 ENCOUNTER — Inpatient Hospital Stay (HOSPITAL_COMMUNITY): Payer: Medicaid Other

## 2019-05-17 ENCOUNTER — Encounter (HOSPITAL_COMMUNITY): Payer: Self-pay | Admitting: Internal Medicine

## 2019-05-17 DIAGNOSIS — E876 Hypokalemia: Secondary | ICD-10-CM | POA: Diagnosis not present

## 2019-05-17 DIAGNOSIS — F1010 Alcohol abuse, uncomplicated: Secondary | ICD-10-CM | POA: Diagnosis not present

## 2019-05-17 DIAGNOSIS — I2510 Atherosclerotic heart disease of native coronary artery without angina pectoris: Secondary | ICD-10-CM | POA: Diagnosis present

## 2019-05-17 DIAGNOSIS — Z7982 Long term (current) use of aspirin: Secondary | ICD-10-CM | POA: Diagnosis not present

## 2019-05-17 DIAGNOSIS — I6381 Other cerebral infarction due to occlusion or stenosis of small artery: Secondary | ICD-10-CM | POA: Diagnosis not present

## 2019-05-17 DIAGNOSIS — Z20822 Contact with and (suspected) exposure to covid-19: Secondary | ICD-10-CM | POA: Diagnosis not present

## 2019-05-17 DIAGNOSIS — I639 Cerebral infarction, unspecified: Secondary | ICD-10-CM | POA: Diagnosis present

## 2019-05-17 DIAGNOSIS — R202 Paresthesia of skin: Secondary | ICD-10-CM | POA: Diagnosis not present

## 2019-05-17 DIAGNOSIS — R9431 Abnormal electrocardiogram [ECG] [EKG]: Secondary | ICD-10-CM | POA: Diagnosis not present

## 2019-05-17 DIAGNOSIS — R269 Unspecified abnormalities of gait and mobility: Secondary | ICD-10-CM | POA: Diagnosis not present

## 2019-05-17 DIAGNOSIS — Z79899 Other long term (current) drug therapy: Secondary | ICD-10-CM | POA: Diagnosis not present

## 2019-05-17 DIAGNOSIS — Z87891 Personal history of nicotine dependence: Secondary | ICD-10-CM | POA: Diagnosis not present

## 2019-05-17 DIAGNOSIS — R112 Nausea with vomiting, unspecified: Secondary | ICD-10-CM | POA: Diagnosis not present

## 2019-05-17 DIAGNOSIS — I161 Hypertensive emergency: Secondary | ICD-10-CM | POA: Diagnosis not present

## 2019-05-17 DIAGNOSIS — R911 Solitary pulmonary nodule: Secondary | ICD-10-CM | POA: Diagnosis not present

## 2019-05-17 DIAGNOSIS — R918 Other nonspecific abnormal finding of lung field: Secondary | ICD-10-CM | POA: Diagnosis not present

## 2019-05-17 DIAGNOSIS — I10 Essential (primary) hypertension: Secondary | ICD-10-CM | POA: Diagnosis not present

## 2019-05-17 DIAGNOSIS — Z8261 Family history of arthritis: Secondary | ICD-10-CM | POA: Diagnosis not present

## 2019-05-17 DIAGNOSIS — E785 Hyperlipidemia, unspecified: Secondary | ICD-10-CM | POA: Diagnosis present

## 2019-05-17 DIAGNOSIS — F1210 Cannabis abuse, uncomplicated: Secondary | ICD-10-CM | POA: Diagnosis present

## 2019-05-17 DIAGNOSIS — E7800 Pure hypercholesterolemia, unspecified: Secondary | ICD-10-CM | POA: Diagnosis present

## 2019-05-17 DIAGNOSIS — Z8249 Family history of ischemic heart disease and other diseases of the circulatory system: Secondary | ICD-10-CM | POA: Diagnosis not present

## 2019-05-17 DIAGNOSIS — R29702 NIHSS score 2: Secondary | ICD-10-CM | POA: Diagnosis not present

## 2019-05-17 LAB — COMPREHENSIVE METABOLIC PANEL
ALT: 17 U/L (ref 0–44)
AST: 15 U/L (ref 15–41)
Albumin: 3.8 g/dL (ref 3.5–5.0)
Alkaline Phosphatase: 60 U/L (ref 38–126)
Anion gap: 9 (ref 5–15)
BUN: 9 mg/dL (ref 6–20)
CO2: 24 mmol/L (ref 22–32)
Calcium: 9.1 mg/dL (ref 8.9–10.3)
Chloride: 104 mmol/L (ref 98–111)
Creatinine, Ser: 0.72 mg/dL (ref 0.61–1.24)
GFR calc Af Amer: >60 mL/min (ref >60)
GFR calc non Af Amer: >60 mL/min (ref >60)
Glucose, Bld: 100 mg/dL — ABNORMAL HIGH (ref 70–99)
Potassium: 3.6 mmol/L (ref 3.5–5.1)
Sodium: 137 mmol/L (ref 135–145)
Total Bilirubin: 1.4 mg/dL — ABNORMAL HIGH (ref 0.3–1.2)
Total Protein: 7.1 g/dL (ref 6.5–8.1)

## 2019-05-17 LAB — CBC
HCT: 49.7 % (ref 39.0–52.0)
Hemoglobin: 16.6 g/dL (ref 13.0–17.0)
MCH: 30.7 pg (ref 26.0–34.0)
MCHC: 33.4 g/dL (ref 30.0–36.0)
MCV: 91.9 fL (ref 80.0–100.0)
Platelets: 339 10*3/uL (ref 150–400)
RBC: 5.41 MIL/uL (ref 4.22–5.81)
RDW: 13 % (ref 11.5–15.5)
WBC: 10.3 10*3/uL (ref 4.0–10.5)
nRBC: 0 % (ref 0.0–0.2)

## 2019-05-17 LAB — RAPID URINE DRUG SCREEN, HOSP PERFORMED
Amphetamines: NOT DETECTED
Barbiturates: NOT DETECTED
Benzodiazepines: NOT DETECTED
Cocaine: NOT DETECTED
Opiates: NOT DETECTED
Tetrahydrocannabinol: POSITIVE — AB

## 2019-05-17 LAB — APTT: aPTT: 31 seconds (ref 24–36)

## 2019-05-17 LAB — LIPID PANEL
Cholesterol: 105 mg/dL (ref 0–200)
HDL: 37 mg/dL — ABNORMAL LOW (ref >40)
LDL Cholesterol: 63 mg/dL (ref 0–99)
Total CHOL/HDL Ratio: 2.8 RATIO
Triglycerides: 26 mg/dL (ref <150)
VLDL: 5 mg/dL (ref 0–40)

## 2019-05-17 LAB — CBC WITH DIFFERENTIAL/PLATELET
Abs Immature Granulocytes: 0.03 10*3/uL (ref 0.00–0.07)
Basophils Absolute: 0 10*3/uL (ref 0.0–0.1)
Basophils Relative: 0 %
Eosinophils Absolute: 0.2 10*3/uL (ref 0.0–0.5)
Eosinophils Relative: 2 %
HCT: 51.1 % (ref 39.0–52.0)
Hemoglobin: 16.9 g/dL (ref 13.0–17.0)
Immature Granulocytes: 0 %
Lymphocytes Relative: 24 %
Lymphs Abs: 2.5 10*3/uL (ref 0.7–4.0)
MCH: 30.5 pg (ref 26.0–34.0)
MCHC: 33.1 g/dL (ref 30.0–36.0)
MCV: 92.1 fL (ref 80.0–100.0)
Monocytes Absolute: 0.6 10*3/uL (ref 0.1–1.0)
Monocytes Relative: 6 %
Neutro Abs: 6.7 10*3/uL (ref 1.7–7.7)
Neutrophils Relative %: 68 %
Platelets: 321 10*3/uL (ref 150–400)
RBC: 5.55 MIL/uL (ref 4.22–5.81)
RDW: 12.9 % (ref 11.5–15.5)
WBC: 10 10*3/uL (ref 4.0–10.5)
nRBC: 0 % (ref 0.0–0.2)

## 2019-05-17 LAB — PROTIME-INR
INR: 1 (ref 0.8–1.2)
Prothrombin Time: 13 seconds (ref 11.4–15.2)

## 2019-05-17 LAB — RESPIRATORY PANEL BY RT PCR (FLU A&B, COVID)
Influenza A by PCR: NEGATIVE
Influenza B by PCR: NEGATIVE
SARS Coronavirus 2 by RT PCR: NEGATIVE

## 2019-05-17 LAB — HEMOGLOBIN A1C
Hgb A1c MFr Bld: 5.5 % (ref 4.8–5.6)
Mean Plasma Glucose: 111.15 mg/dL

## 2019-05-17 LAB — HIV ANTIBODY (ROUTINE TESTING W REFLEX): HIV Screen 4th Generation wRfx: NONREACTIVE

## 2019-05-17 LAB — CREATININE, SERUM
Creatinine, Ser: 0.52 mg/dL — ABNORMAL LOW (ref 0.61–1.24)
GFR calc Af Amer: >60 mL/min (ref >60)
GFR calc non Af Amer: >60 mL/min (ref >60)

## 2019-05-17 MED ORDER — CARVEDILOL 3.125 MG PO TABS
3.1250 mg | ORAL_TABLET | Freq: Two times a day (BID) | ORAL | Status: DC
  Administered 2019-05-17 – 2019-05-19 (×4): 3.125 mg via ORAL
  Filled 2019-05-17 (×4): qty 1

## 2019-05-17 MED ORDER — ACETAMINOPHEN 650 MG RE SUPP: 650.0000 mg | RECTAL | Status: DC | PRN

## 2019-05-17 MED ORDER — THIAMINE HCL 100 MG/ML IJ SOLN: 100.0000 mg | Freq: Every day | INTRAMUSCULAR | Status: DC

## 2019-05-17 MED ORDER — IOHEXOL 350 MG/ML SOLN
100.0000 mL | Freq: Once | INTRAVENOUS | Status: AC | PRN
  Administered 2019-05-17: 100 mL via INTRAVENOUS

## 2019-05-17 MED ORDER — ADULT MULTIVITAMIN W/MINERALS CH
1.0000 | ORAL_TABLET | Freq: Every day | ORAL | Status: DC
  Administered 2019-05-17 – 2019-05-19 (×3): 1 via ORAL
  Filled 2019-05-17 (×3): qty 1

## 2019-05-17 MED ORDER — SODIUM CHLORIDE 0.9 % IV SOLN: INTRAVENOUS | Status: AC

## 2019-05-17 MED ORDER — ASPIRIN 300 MG RE SUPP: 300.0000 mg | Freq: Every day | RECTAL | Status: DC

## 2019-05-17 MED ORDER — LORAZEPAM 1 MG PO TABS: 1.0000 mg | ORAL_TABLET | ORAL | Status: DC | PRN

## 2019-05-17 MED ORDER — ATORVASTATIN CALCIUM 10 MG PO TABS
20.0000 mg | ORAL_TABLET | Freq: Every day | ORAL | Status: DC
  Administered 2019-05-17 – 2019-05-18 (×2): 20 mg via ORAL
  Filled 2019-05-17 (×2): qty 2

## 2019-05-17 MED ORDER — ASPIRIN 325 MG PO TABS
325.0000 mg | ORAL_TABLET | Freq: Every day | ORAL | Status: DC
  Administered 2019-05-17 – 2019-05-18 (×2): 325 mg via ORAL
  Filled 2019-05-17 (×2): qty 1

## 2019-05-17 MED ORDER — ASPIRIN 81 MG PO CHEW
324.0000 mg | CHEWABLE_TABLET | Freq: Once | ORAL | Status: AC
  Administered 2019-05-17: 324 mg via ORAL
  Filled 2019-05-17: qty 4

## 2019-05-17 MED ORDER — HYDRALAZINE HCL 20 MG/ML IJ SOLN
10.0000 mg | INTRAMUSCULAR | Status: DC | PRN
  Administered 2019-05-17: 10 mg via INTRAVENOUS
  Filled 2019-05-17: qty 1

## 2019-05-17 MED ORDER — FOLIC ACID 1 MG PO TABS
1.0000 mg | ORAL_TABLET | Freq: Every day | ORAL | Status: DC
  Administered 2019-05-17 – 2019-05-19 (×3): 1 mg via ORAL
  Filled 2019-05-17 (×3): qty 1

## 2019-05-17 MED ORDER — NICOTINE 14 MG/24HR TD PT24
14.0000 mg | MEDICATED_PATCH | Freq: Every day | TRANSDERMAL | Status: DC
  Administered 2019-05-17 – 2019-05-19 (×3): 14 mg via TRANSDERMAL
  Filled 2019-05-17 (×3): qty 1

## 2019-05-17 MED ORDER — LORAZEPAM 2 MG/ML IJ SOLN: 1.0000 mg | INTRAMUSCULAR | Status: DC | PRN

## 2019-05-17 MED ORDER — ACETAMINOPHEN 325 MG PO TABS
650.0000 mg | ORAL_TABLET | ORAL | Status: DC | PRN
  Administered 2019-05-17 (×2): 650 mg via ORAL
  Filled 2019-05-17 (×2): qty 2

## 2019-05-17 MED ORDER — LABETALOL HCL 5 MG/ML IV SOLN
10.0000 mg | INTRAVENOUS | Status: DC | PRN
  Administered 2019-05-17 – 2019-05-18 (×3): 10 mg via INTRAVENOUS
  Filled 2019-05-17 (×3): qty 4

## 2019-05-17 MED ORDER — THIAMINE HCL 100 MG PO TABS
100.0000 mg | ORAL_TABLET | Freq: Every day | ORAL | Status: DC
  Administered 2019-05-17 – 2019-05-19 (×3): 100 mg via ORAL
  Filled 2019-05-17 (×3): qty 1

## 2019-05-17 MED ORDER — STROKE: EARLY STAGES OF RECOVERY BOOK
Freq: Once | Status: AC
  Filled 2019-05-17: qty 1

## 2019-05-17 MED ORDER — ACETAMINOPHEN 160 MG/5ML PO SOLN: 650.0000 mg | ORAL | Status: DC | PRN

## 2019-05-17 MED ORDER — SODIUM CHLORIDE (PF) 0.9 % IJ SOLN
INTRAMUSCULAR | Status: AC
  Filled 2019-05-17: qty 50

## 2019-05-17 MED ORDER — ENOXAPARIN SODIUM 40 MG/0.4ML ~~LOC~~ SOLN
40.0000 mg | SUBCUTANEOUS | Status: DC
  Administered 2019-05-17 – 2019-05-19 (×3): 40 mg via SUBCUTANEOUS
  Filled 2019-05-17 (×3): qty 0.4

## 2019-05-17 MED ORDER — AMLODIPINE BESYLATE 10 MG PO TABS
10.0000 mg | ORAL_TABLET | Freq: Every day | ORAL | Status: DC
  Administered 2019-05-17 – 2019-05-19 (×3): 10 mg via ORAL
  Filled 2019-05-17 (×3): qty 1

## 2019-05-17 MED ORDER — LISINOPRIL 10 MG PO TABS
10.0000 mg | ORAL_TABLET | Freq: Every day | ORAL | Status: DC
  Administered 2019-05-17 – 2019-05-18 (×2): 10 mg via ORAL
  Filled 2019-05-17 (×2): qty 1

## 2019-05-17 NOTE — ED Notes (Signed)
Attempted report x1. 

## 2019-05-17 NOTE — H&P (Signed)
History and Physical    Victor Davies ZYS:[id number] DOB: [birth year] DOA: 05/16/2019  PCP: Marcine Matar, MD  Patient coming from: Home.  Chief Complaint: Dizziness nausea vomiting.  HPI: Victor Davies is a 44 y.o. male with history of tobacco abuse, hypertension had a history of alcohol abuse patient states he has not had any alcohol for last 3 to 4 weeks has been experiencing increasing dizziness over the last 4 weeks.  Also has some numbness and tightness of the right upper and lower extremities over the same period of time last 4 weeks.  He has recently followed up with his primary care physician for uncontrolled hypertension after the ER visit and lisinopril was added along with his Norvasc.  Patient's dizziness and nausea vomiting acutely worsened over the last 24 hours and presents to the ER.  Has some difficulty speaking.  Denies any weakness of the upper or lower extremities or any visual symptoms.  ED Course: In the ER MRI of the brain shows acute CVA EKG shows normal sinus rhythm with nonspecific changes.  On exam patient is able to move all extremities 5 x 5.  On-call neurologist Dr. Otelia Limes was consulted.  Labs reveal normal metabolic panel with CBC showing WBC of 10.6 hemoglobin of 17.5.  Covid test was negative.  Urine drug screen is positive for marijuana.  Patient admitted for acute CVA.  Review of Systems: As per HPI, rest all negative.   Past Medical History:  Diagnosis Date  . Hypertension     History reviewed. No pertinent surgical history.   reports that he has been smoking. He has a 5.00 pack-year smoking history. He has never used smokeless tobacco. He reports current alcohol use of about 14.0 - 21.0 standard drinks of alcohol per week. No history on file for drug.  No Known Allergies  Family History  Problem Relation Age of Onset  . Hypertension Father     Prior to Admission medications   Medication Sig Start Date End Date Taking? Authorizing  Provider  amLODipine (NORVASC) 10 MG tablet Take 1 tablet (10 mg total) by mouth daily. 05/10/19  Yes Marcine Matar, MD  aspirin EC 81 MG tablet Take 1 tablet (81 mg total) by mouth daily. 05/10/19  Yes Marcine Matar, MD  atorvastatin (LIPITOR) 20 MG tablet Take 1 tablet (20 mg total) by mouth daily. 05/11/19  Yes Marcine Matar, MD  lisinopril (ZESTRIL) 10 MG tablet Take 1 tablet (10 mg total) by mouth daily. 05/10/19  Yes Marcine Matar, MD  varenicline (CHANTIX) 0.5 MG tablet Take 1 tablet (0.5 mg total) by mouth 2 (two) times daily. For smoking cessation 05/10/19   Marcine Matar, MD    Physical Exam: Constitutional: Moderately built and nourished. Vitals:   05/16/19 2004 05/16/19 2100 05/16/19 2210 05/16/19 2330  BP: (!) 159/101 (!) 176/115 (!) 189/119 (!) 178/107  Pulse: 64 79 (!) 102 88  Resp: 20 (!) 23 (!) 22 (!) 22  Temp:      TempSrc:      SpO2: 99% 97% 96% 100%   Eyes: Anicteric no pallor. ENMT: No discharge from the ears eyes nose or mouth. Neck: No mass felt.  No neck rigidity. Respiratory: No rhonchi or crepitations. Cardiovascular: S1-S2 heard. Abdomen: Soft nontender bowel sounds present. Musculoskeletal: No edema. Skin: No rash. Neurologic: Alert awake oriented to time place and person.  Moves all extremities 5 x 5.  No facial asymmetry tongue is midline.  Pupils equal reacting  to light. Psychiatric: Appears normal.   Labs on Admission: I have personally reviewed following labs and imaging studies  CBC: Recent Labs  Lab 05/16/19 2004  WBC 10.6*  HGB 17.5*  HCT 53.2*  MCV 93.3  PLT 409   Basic Metabolic Panel: Recent Labs  Lab 05/16/19 2004  NA 140  K 4.2  CL 103  CO2 26  GLUCOSE 116*  BUN 13  CREATININE 0.85  CALCIUM 9.3   GFR: Estimated Creatinine Clearance: 109.7 mL/min (by C-G formula based on SCr of 0.85 mg/dL). Liver Function Tests: Recent Labs  Lab 05/10/19 1606 05/16/19 2004  AST 21 19  ALT 14 22  ALKPHOS 81 67    BILITOT 0.8 1.0  PROT 7.6 8.0  ALBUMIN 4.8 4.3   Recent Labs  Lab 05/16/19 2004  LIPASE 22   No results for input(s): AMMONIA in the last 168 hours. Coagulation Profile: No results for input(s): INR, PROTIME in the last 168 hours. Cardiac Enzymes: No results for input(s): CKTOTAL, CKMB, CKMBINDEX, TROPONINI in the last 168 hours. BNP (last 3 results) No results for input(s): PROBNP in the last 8760 hours. HbA1C: No results for input(s): HGBA1C in the last 72 hours. CBG: No results for input(s): GLUCAP in the last 168 hours. Lipid Profile: No results for input(s): CHOL, HDL, LDLCALC, TRIG, CHOLHDL, LDLDIRECT in the last 72 hours. Thyroid Function Tests: No results for input(s): TSH, T4TOTAL, FREET4, T3FREE, THYROIDAB in the last 72 hours. Anemia Panel: No results for input(s): VITAMINB12, FOLATE, FERRITIN, TIBC, IRON, RETICCTPCT in the last 72 hours. Urine analysis:    Component Value Date/Time   COLORURINE YELLOW 05/16/2019 2004   APPEARANCEUR CLEAR 05/16/2019 2004   LABSPEC 1.015 05/16/2019 2004   PHURINE 6.0 05/16/2019 2004   GLUCOSEU 150 (A) 05/16/2019 2004   HGBUR NEGATIVE 05/16/2019 2004   BILIRUBINUR NEGATIVE 05/16/2019 2004   KETONESUR 20 (A) 05/16/2019 2004   PROTEINUR NEGATIVE 05/16/2019 2004   NITRITE NEGATIVE 05/16/2019 2004   LEUKOCYTESUR NEGATIVE 05/16/2019 2004   Sepsis Labs: @LABRCNTIP (procalcitonin:4,lacticidven:4) )No results found for this or any previous visit (from the past 240 hour(s)).   Radiological Exams on Admission: MR Brain Wo Contrast (neuro protocol)  Result Date: 05/16/2019 CLINICAL DATA:  Initial evaluation for acute dizziness. EXAM: MRI HEAD WITHOUT CONTRAST TECHNIQUE: Multiplanar, multiecho pulse sequences of the brain and surrounding structures were obtained without intravenous contrast. COMPARISON:  Prior head CT from 05/08/2019. FINDINGS: Brain: Generalized age-related cerebral atrophy. Advanced chronic small vessel ischemic  disease with innumerable remote lacunar infarcts seen clustered about the bilateral basal ganglia, thalami, and pons. Few additional tiny remote bilateral cerebellar infarcts noted. Superimposed scattered multiple foci of susceptibility artifact consistent with chronic microhemorrhages, likely related to poorly controlled hypertension. 6 mm focus of restricted diffusion involving the right parietal cortex consistent with a small acute ischemic infarct (series 5, image 78). No associated hemorrhage or mass effect. There is an additional punctate focus of diffusion abnormality involving the dorsal brainstem at the pontomedullary junction (series 5, image 59), which could reflect an additional small acute to subacute ischemic infarct. Difficult to be certain this finding given the adjacent chronic micro hemorrhages in susceptibility artifact. No associated acute hemorrhage or mass effect. No other evidence for acute or subacute ischemia. Gray-white matter differentiation otherwise maintained. No mass lesion, midline shift, or mass effect. No hydrocephalus or extra-axial fluid collection. Pituitary gland suprasellar region within normal limits. Midline structures intact. Vascular: Right vertebral artery hypoplastic with loss of normal flow void, likely  occluded. Probable atherosclerotic change about the vertebrobasilar junction and proximal basilar artery. Major intracranial vascular flow voids otherwise maintained. Skull and upper cervical spine: Craniocervical junction within normal limits. Bone marrow signal intensity normal. No scalp soft tissue abnormality. Sinuses/Orbits: Globes and orbital soft tissues within normal limits. Extensive mucosal thickening in opacity seen throughout the paranasal sinuses, greatest within the frontoethmoidal sinuses. Few scattered superimposed air-fluid levels noted within the sphenoid sinuses. No mastoid effusion. Inner ear structures grossly normal. Other: None. IMPRESSION: 1. 6 mm  acute ischemic nonhemorrhagic cortical infarct involving the right parietal lobe. 2. Additional punctate focus of diffusion abnormality involving the dorsal brainstem at the pontomedullary junction, which could reflect an acute to early subacute ischemic infarct or possibly artifact. Difficult to be certain given adjacent susceptibility artifact within this region. 3. 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 4. Multiple superimposed chronic micro hemorrhages clustered about the deep gray nuclei and brainstem, likely related to poorly controlled hypertension. 5. Acute on chronic pansinusitis, likely allergic/inflammatory in nature. Electronically Signed   By: Rise Mu M.D.   On: 05/16/2019 23:49    EKG: Independently reviewed.  Normal sinus rhythm with LVH.  Assessment/Plan Principal Problem:   Acute CVA (cerebrovascular accident) [facility] Active Problems:   Essential hypertension    1. Acute CVA -discussed with on-call neurologist Dr. Otelia Limes who advised getting CT angiogram of the head and neck which I have ordered.  Patient passed swallow.  We will keep patient on neurochecks check hemoglobin A1c lipid panel physical therapy consult.  Allow for permissive hypertension. 2. Hypertensive urgency will allow for permissive hypertension given that patient has acute CVA.  As needed IV hydralazine for systolic blood pressure more than 220 and diastolic more than 120. 3. Hyperlipidemia on statins. 4. Tobacco abuse advised about quitting. 5. Abnormal recent chest x-ray will get a CT chest for further assessment. 6. History of alcohol abuse patient states he has not had any alcohol for the last 4 weeks.  Thiamine. 7. Nausea vomiting likely from CVA.  Abdomen appears benign.  Follow LFTs.  Given that patient has acute CVA will need close monitoring for any  further deterioration in inpatient status.   DVT prophylaxis: Lovenox. Code Status: Full code. Family Communication: Patient's wife. Disposition Plan: Home. Consults called: Neurologist. Admission status: Inpatient.   Eduard Clos MD [facility] Pager [phone].  If 7PM-7AM, please contact night-coverage [email or web address] Password [facility]  05/17/2019, 12:52 AM

## 2019-05-17 NOTE — Evaluation (Signed)
Speech Language Pathology Evaluation Patient Details Name: Victor Davies MRN: [id number] DOB: [birth year] Today's Date: 05/17/2019 Time: 2542-7062 SLP Time Calculation (min) (ACUTE ONLY): 23 min  Problem List:  Patient Active Problem List   Diagnosis Date Noted  . Acute CVA (cerebrovascular accident) (HCC) 05/17/2019  . Essential hypertension 05/10/2019  . Tobacco dependence 05/10/2019  . 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 Opacity of lung on imaging study 05/10/2019  . Polycythemia 05/10/2019   Past Medical History:  Past Medical History:  Diagnosis Date  . Hypertension    Past Surgical History: History reviewed. No pertinent surgical history. HPI:  Pt is a 44 y.o. male with history of tobacco abuse, hypertension had a history of alcohol abuse patient states he has not had any alcohol for last 3 to 4 weeks. He presented to the ED secondary to increasing dizziness, numbness and tightness of the right upper and lower extremities for 4 weeks. MRI brain: 6 mm acute ischemic nonhemorrhagic cortical infarct involving the right parietal lobe. Additional punctate focus of diffusion abnormality involving the dorsal brainstem at the pontomedullary junction, which could reflect an acute to early subacute ischemic infarct or possibly artifact.   Assessment / Plan / Recommendation Clinical Impression  Pt participated in speech/language/cognition evaluation with his wife present. 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 Pt reported that he completed high school in [place] but only went to the ninth grade when he moved to the [place]. Pt denied any chronic or acute speech, language of cognitive deficits. However, pt's wife cited acute changes in memory, processing speed, and speech clarity. She stated that she believes is approximately 75% back to baseline with regards to cognition. The [facility] Mental Status Examination was completed to evaluate the pt's cognitive-linguistic skills. He achieved a score of 9/30 which is below the normal limits of 27 or more out of 30. He exhibited deficits in the areas of awareness, temporal orientation, attention, memory, executive function, and complex problem solving. He also presented with mild dysarthria characterized by reduced articulatory precision but speech intelligibility was within normal limits. Skilled SLP services are clinically indicated at this time to improve cognitive-linguistic function.    SLP Assessment  SLP Recommendation/Assessment: Patient needs continued Speech Lanaguage Pathology Services SLP Visit Diagnosis: Cognitive communication deficit (R41.841);Dysarthria and anarthria (R47.1)    Follow Up Recommendations  (SLP services at level of care recommended by PT/OT)    Frequency and Duration min 2x/week  2 weeks      SLP Evaluation Cognition  Overall Cognitive Status: Impaired/Different from baseline Arousal/Alertness: Awake/alert Orientation Level: Oriented to person;Oriented to place;Disoriented to time(Year: 2020; Month: denied; Day: Wednesday; Date: denied) Attention: Focused;Sustained Focused Attention: Impaired Focused Attention Impairment: Verbal complex Sustained Attention: Impaired Sustained Attention Impairment: Verbal complex Memory: Impaired Memory Impairment: Storage deficit;Retrieval deficit;Decreased recall of new information(Immediate: 5/5; delayed: 0/5; with cues: 3/5) Awareness: Impaired Awareness Impairment: Intellectual impairment Problem Solving: Impaired Problem Solving Impairment: Verbal complex       Comprehension  Auditory Comprehension Overall Auditory Comprehension: Appears within functional limits for tasks assessed Yes/No Questions: Within Functional Limits Commands: Within Functional Limits Conversation: Complex Visual Recognition/Discrimination Discrimination: Within  Function Limits    Expression Expression Primary Mode of Expression: Verbal Verbal Expression Overall Verbal Expression: Appears within functional limits for tasks assessed Initiation: No impairment Level of Generative/Spontaneous Verbalization: Conversation Repetition: No impairment Naming: No impairment Pragmatics: No impairment Written  Expression Dominant Hand: Right   Oral / Motor  Oral Motor/Sensory Function Overall Oral Motor/Sensory Function: Mild impairment Facial ROM: Reduced left;Suspected CN VII (facial) dysfunction Facial Symmetry: Within Functional Limits Facial Strength: Within Functional Limits Facial Sensation: Within Functional Limits Lingual ROM: Within Functional Limits Lingual Symmetry: Within Functional Limits Lingual Strength: Reduced;Suspected CN XII (hypoglossal) dysfunction Lingual Sensation: Within Functional Limits Velum: Within Functional Limits Mandible: Within Functional Limits Motor Speech Overall Motor Speech: Impaired Respiration: Within functional limits Phonation: Normal Resonance: Within functional limits Articulation: Impaired Level of Impairment: Conversation Intelligibility: Intelligible Motor Planning: Witnin functional limits Motor Speech Errors: Aware;Consistent   Albina Gosney I. Hardin Negus, [place], [place] Office number [phone] Pager [place] 05/17/2019, 5:03 PM

## 2019-05-17 NOTE — ED Provider Notes (Signed)
  Provider Note MRN:  [id number]  Arrival date & time: 05/17/19    ED Course and Medical Decision Making  Assumed care from Dr. Lockie Mola at shift change.  On and off dizziness for several days, worse today with vomiting.  Question central versus peripheral vertigo.  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  On my assessment patient has no focal neurological deficits.  Wife endorsing some changes to his speech with stuttering but this has been going on for weeks.  Signs of prolonged and untreated hypertension on his MRI.  Hypertensive here but will allow permissive hypertension in the setting of acute stroke.  Discussed case with Dr. Otelia Limes of neurology, who agrees with admission to the hospital service at [facility] for further care.  .Critical Care Performed by: Sabas Sous, MD Authorized by: Sabas Sous, MD   Critical care provider statement:    Critical care time (minutes):  32   Critical care was necessary to treat or prevent imminent or life-threatening deterioration of the following conditions: Acute ischemic stroke.   Critical care was time spent personally by me on the following activities:  Discussions with consultants, evaluation of patient's response to treatment, examination of patient, ordering and performing treatments and interventions, ordering and review of laboratory studies, ordering and review of radiographic studies, pulse oximetry, re-evaluation of patient's condition, obtaining history from patient or surrogate and review of old charts    Final Clinical Impressions(s) / ED Diagnoses     ICD-10-CM   1. Dizziness  R42   2. Acute ischemic stroke [facility])  I63.9     ED Discharge Orders    None      Discharge Instructions   None     Elmer Sow. Pilar Plate, MD [facility] Emergency Medicine [facility] [email or web address]    Sabas Sous, MD 05/17/19 Jacinta Shoe

## 2019-05-17 NOTE — ED Notes (Signed)
Pt provided hygiene supplies and setup at sink in room.  Pt one person assistance to sink.

## 2019-05-17 NOTE — ED Notes (Signed)
Patient transported to CT 

## 2019-05-17 NOTE — ED Notes (Signed)
This RN spoke with Diane from [facility] regarding pts Chest CT.  Per Diane, pt with calcification at level of left main coronary artery, recommendations for cardiology f/u.  Admitting provider Blake Divine paged.

## 2019-05-17 NOTE — Progress Notes (Signed)
PROGRESS NOTE    Victor Davies  ZOX:[id number] DOB: 21-Jun-1975 DOA: 05/16/2019 PCP: Marcine Matar, MD   Brief Narrative:  Victor Davies is a 44 y.o. male with history of tobacco abuse, hypertension had a history of alcohol abuse patient states he has not had any alcohol for last 3 to 4 weeks has been experiencing increasing dizziness over the last 4 weeks.  Also has some numbness and tightness of the right upper and lower extremities over the same period of time last 4 weeks.  He has recently followed up with his primary care physician for uncontrolled hypertension after the ER visit and lisinopril was added along with his Norvasc.  Patient's dizziness and nausea vomiting acutely worsened over the last 24 hours and presents to the ER.  Has some difficulty speaking.  Denies any weakness of the upper or lower extremities or any visual symptoms.In the ED MRI of the brain shows acute CVA, EKG shows normal sinus rhythm with nonspecific changes.  On exam patient is able to move all extremities 5 x 5.  On-call neurologist Dr. Otelia Limes was consulted.  Labs reveal normal metabolic panel with CBC showing WBC of 10.6 hemoglobin of 17.5.  Covid test was negative.  Urine drug screen is positive for marijuana.  Patient admitted for acute CVA.   Assessment & Plan:   Principal Problem:   Acute CVA (cerebrovascular accident) [facility]) Active Problems:   Essential hypertension   Acute CVA, punctate infarct of the brainstem and right parietal lobe - MRI shows multiple chronic lacunar infarctions and 2 new punctate acute ischemic infarctions, one in the brainstem and one in the right parietal lobe - Dr. Otelia Limes who advised getting CT angiogram of the head and neck - TTE pending, continue tele, PT/OT/SLP evaluation - No need for permissive HTN per neuro given small areas affected - Defer to neuro for initiation of statin/antiplatelet therapy (Asa 81 ongoing currently) Lab Results  Component Value Date     HGBA1C 5.5 05/17/2019   Lipid Panel     Component Value Date/Time   CHOL 105 05/17/2019 0101   CHOL 247 (H) 05/10/2019 1606   TRIG 26 05/17/2019 0101   HDL 37 (L) 05/17/2019 0101   HDL 69 05/10/2019 1606   CHOLHDL 2.8 05/17/2019 0101   VLDL 5 05/17/2019 0101   LDLCALC 63 05/17/2019 0101   LDLCALC 166 (H) 05/10/2019 1606   LABVLDL 12 05/10/2019 1606    Hypertensive emergency  - In the setting of above - Tight control given neuro recommendations - no need for permissive hypertension  Nausea/vomiting - Likely secondary to above given benign abdomen - Continue supportive care - may be having vertiginous symptoms 2/2 location of stroke  Hyperlipidemia  - Already prescribed atorvastatin 20 - likely needs to be increased/changed to crestor given above.  Tobacco abuse - On chantix - discussed cessation  History of alcohol abuse  - patient states he has not had any alcohol for the last 4 weeks. Follow clinically  DVT prophylaxis: Lovenox. Code Status: Full code. Family Communication: Patient's wife. Disposition Plan: Home. Consults called: Neurologist. Admission status: Inpatient.  Subjective: No acute issues/events to report, denies chest pain, headache, fevers, chills, constipation, diarrhea.  Objective: Vitals:   05/17/19 1100 05/17/19 1202 05/17/19 1233 05/17/19 1320  BP: (!) 185/117 (!) 175/110 (!) 182/126 (!) 167/103  Pulse: 80   82  Resp: 14   20  Temp:    98.4 F (36.9 C)  TempSrc:    Oral  SpO2:  97%   100%    Intake/Output Summary (Last 24 hours) at 05/17/2019 1330 Last data filed at 05/17/2019 0111 Gross per 24 hour  Intake 1000 ml  Output --  Net 1000 ml   There were no vitals filed for this visit.  Examination:  General exam: Appears calm and comfortable  Respiratory system: Clear to auscultation. Respiratory effort normal. Cardiovascular system: S1 & S2 heard, RRR. No JVD, murmurs, rubs, gallops or clicks. No pedal edema. Gastrointestinal  system: Abdomen is nondistended, soft and nontender. No organomegaly or masses felt. Normal bowel sounds heard. Central nervous system: Alert and oriented. No focal neurological deficits. Extremities: Symmetric 5 x 5 power. Skin: No rashes, lesions or ulcers Psychiatry: Judgement and insight appear normal. Mood & affect appropriate.     Data Reviewed: I have personally reviewed following labs and imaging studies  CBC: Recent Labs  Lab 05/16/19 2004 05/17/19 0101 05/17/19 0617  WBC 10.6* 10.3 10.0  NEUTROABS  --   --  6.7  HGB 17.5* 16.6 16.9  HCT 53.2* 49.7 51.1  MCV 93.3 91.9 92.1  PLT 342 339 321   Basic Metabolic Panel: Recent Labs  Lab 05/16/19 2004 05/17/19 0101 05/17/19 0617  NA 140  --  137  K 4.2  --  3.6  CL 103  --  104  CO2 26  --  24  GLUCOSE 116*  --  100*  BUN 13  --  9  CREATININE 0.85 0.52* 0.72  CALCIUM 9.3  --  9.1   GFR: Estimated Creatinine Clearance: 116.6 mL/min (by C-G formula based on SCr of 0.72 mg/dL). Liver Function Tests: Recent Labs  Lab 05/10/19 1606 05/16/19 2004 05/17/19 0617  AST 21 19 15   ALT 14 22 17   ALKPHOS 81 67 60  BILITOT 0.8 1.0 1.4*  PROT 7.6 8.0 7.1  ALBUMIN 4.8 4.3 3.8   Recent Labs  Lab 05/16/19 2004  LIPASE 22   No results for input(s): AMMONIA in the last 168 hours. Coagulation Profile: Recent Labs  Lab 05/17/19 0011  INR 1.0   Cardiac Enzymes: No results for input(s): CKTOTAL, CKMB, CKMBINDEX, TROPONINI in the last 168 hours. BNP (last 3 results) No results for input(s): PROBNP in the last 8760 hours. HbA1C: Recent Labs    05/17/19 0101  HGBA1C 5.5   CBG: No results for input(s): GLUCAP in the last 168 hours. Lipid Profile: Recent Labs    05/17/19 0101  CHOL 105  HDL 37*  LDLCALC 63  TRIG 26  CHOLHDL 2.8   Thyroid Function Tests: No results for input(s): TSH, T4TOTAL, FREET4, T3FREE, THYROIDAB in the last 72 hours. Anemia Panel: No results for input(s): VITAMINB12, FOLATE,  FERRITIN, TIBC, IRON, RETICCTPCT in the last 72 hours. Sepsis Labs: No results for input(s): PROCALCITON, LATICACIDVEN in the last 168 hours.  Recent Results (from the past 240 hour(s))  Respiratory Panel by RT PCR (Flu A&B, Covid) - Nasopharyngeal Swab     Status: None   Collection Time: 05/17/19  1:01 AM   Specimen: Nasopharyngeal Swab  Result Value Ref Range Status   SARS Coronavirus 2 by RT PCR NEGATIVE NEGATIVE Final    Comment: (NOTE) SARS-CoV-2 target nucleic acids are NOT DETECTED. The SARS-CoV-2 RNA is generally detectable in upper respiratoy specimens during the acute phase of infection. The lowest concentration of SARS-CoV-2 viral copies this assay can detect is 131 copies/mL. A negative result does not preclude SARS-Cov-2 infection and should not be used as the sole basis for  treatment or other patient management decisions. A negative result may occur with  improper specimen collection/handling, submission of specimen other than nasopharyngeal swab, presence of viral mutation(s) within the areas targeted by this assay, and inadequate number of viral copies (<131 copies/mL). A negative result must be combined with clinical observations, patient history, and epidemiological information. The expected result is Negative. Fact Sheet for Patients:  https://www.moore.com/ Fact Sheet for Healthcare Providers:  https://www.young.biz/ This test is not yet ap proved or cleared by the [place] FDA and  has been authorized for detection and/or diagnosis of SARS-CoV-2 by FDA under an Emergency Use Authorization (EUA). This EUA will remain  in effect (meaning this test can be used) for the duration of the COVID-19 declaration under Section 564(b)(1) of the Act, 21 U.S.C. section 360bbb-3(b)(1), unless the authorization is terminated or revoked sooner.    Influenza A by PCR NEGATIVE NEGATIVE Final   Influenza B by PCR NEGATIVE NEGATIVE Final     Comment: (NOTE) The Xpert Xpress SARS-CoV-2/FLU/RSV assay is intended as an aid in  the diagnosis of influenza from Nasopharyngeal swab specimens and  should not be used as a sole basis for treatment. Nasal washings and  aspirates are unacceptable for Xpert Xpress SARS-CoV-2/FLU/RSV  testing. Fact Sheet for Patients: https://www.moore.com/ Fact Sheet for Healthcare Providers: https://www.young.biz/ This test is not yet approved or cleared by the [place] FDA and  has been authorized for detection and/or diagnosis of SARS-CoV-2 by  FDA under an Emergency Use Authorization (EUA). This EUA will remain  in effect (meaning this test can be used) for the duration of the  Covid-19 declaration under Section 564(b)(1) of the Act, 21  U.S.C. section 360bbb-3(b)(1), unless the authorization is  terminated or revoked. Performed at [facility], [address] [place], [place] [address]          Radiology Studies: CT ANGIO HEAD W OR WO CONTRAST  Result Date: 05/17/2019 CLINICAL DATA:  Follow-up examination for acute stroke. EXAM: CT ANGIOGRAPHY HEAD AND NECK TECHNIQUE: Multidetector CT imaging of the head and neck was performed using the standard protocol during bolus administration of intravenous contrast. Multiplanar CT image reconstructions and MIPs were obtained to evaluate the vascular anatomy. Carotid stenosis measurements (when applicable) are obtained utilizing NASCET criteria, using the distal internal carotid diameter as the denominator. CONTRAST:  OMNIPAQUE IOHEXOL 350 MG/ML SOLN COMPARISON:  Prior brain MRI from 05/16/2019. FINDINGS: CT HEAD FINDINGS Brain: Mildly advanced cerebral atrophy for age. Advanced chronic microvascular ischemic disease with multiple remote lacunar infarcts clustered about the deep gray nuclei and pons, better seen on prior brain MRI. Previously identified small strokes not visible by CT. No  intracranial hemorrhage. No mass lesion, midline shift or mass effect. No hydrocephalus or extra-axial fluid collection. Vascular: No appreciable hyperdense vessel, better evaluated on concomitant CTA. Scattered atherosclerotic change at the skull base. Skull: Scalp soft tissues and calvarium within normal limits. Sinuses: Extensive acute on chronic pan sinusitis as previously described again noted. Mastoid air cells remain clear. Orbits: Globes and orbital soft tissues demonstrate no acute finding. Review of the MIP images confirms the above findings CTA NECK FINDINGS Aortic arch: Visualized arch of normal caliber with normal branch pattern. No hemodynamically significant stenosis seen about the origin of the great vessels. Visualized subclavian arteries widely patent. Right carotid system: Right common carotid artery patent from its origin to the bifurcation without stenosis. Mild eccentric calcified plaque at the proximal right ICA without stenosis. Right ICA widely  patent distally without stenosis, dissection or occlusion. Left carotid system: Left CCA patent from its origin to the bifurcation without stenosis. Scattered calcified plaque about the left bifurcation/proximal left ICA with associated stenosis of up to approximately 45-50% by NASCET criteria. Left ICA otherwise patent distally to the skull base without stenosis, dissection, or occlusion. Vertebral arteries: Both vertebral arteries arise from the subclavian arteries. Left vertebral artery dominant, with a diffusely hypoplastic right vertebral artery. Vertebral body res widely patent within the neck without stenosis or other abnormality. Skeleton: No acute osseous finding. No discrete or worrisome osseous lesions. Poor dentition with innumerable dental caries and periapical lucencies noted. Other neck: No other acute soft tissue abnormality within the neck. No mass lesion or adenopathy. Upper chest: Visualized upper chest demonstrates no acute  finding. Review of the MIP images confirms the above findings CTA HEAD FINDINGS Anterior circulation: Petrous segments patent bilaterally. Scattered atheromatous irregularity within the cavernous/supraclinoid ICAs bilaterally, left slightly greater than right. Short-segment moderate stenosis seen involving the anterior genu of the cavernous left ICA (series 11, image 225). No significant narrowing about the right carotid siphon. ICA termini well perfused. A1 segments patent bilaterally. Normal anterior communicating artery complex. Right A2 segment widely patent to its distal aspect. Short-segment severe stenosis seen involving the mid left A2 segment (series 14, image 49). No M1 stenosis or occlusion. Negative MCA bifurcations. Distal MCA branches well perfused and symmetric. Posterior circulation: Dominant left vertebral artery irregular but patent to the vertebrobasilar junction without stenosis. Hypoplastic right vertebral artery largely terminates in PICA, and is either occluded or severely stenotic distally. Vertebrobasilar junction and proximal basilar artery are irregular with a dolichoectatic appearance. The mid and distal basilar artery are diminutive but patent. Superior cerebral arteries grossly patent bilaterally. Predominant fetal type origin of both PCAs. PCAs grossly patent to their distal aspects without stenosis. Venous sinuses: Grossly patent allowing for timing the contrast bolus. Anatomic variants: Predominant fetal type origin of both PCAs. Review of the MIP images confirms the above findings IMPRESSION: CT HEAD IMPRESSION: 1. Advanced chronic microvascular ischemic disease for age with multiple remote lacunar infarcts involving the deep gray nuclei and pons, better characterized on recent brain MRI. Recently identified subcentimeter infarcts not visible by CT. 2. No other new acute intracranial abnormality. 3. Acute on chronic pansinusitis. CTA HEAD AND NECK IMPRESSION: 1. Negative CTA for  large vessel occlusion. 2. 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 The mid and distal basilar artery are diminutive but patent, with predominant fetal type origin of both PCAs. 3. 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 Dominant left vertebral artery patent without high-grade stenosis. 4. Atheromatous plaque about the left carotid bifurcation with estimated 45-50% stenosis by NASCET criteria. 5. Short-segment severe mid left A2 stenosis. Electronically Signed   By: Rise Mu M.D.   On: 05/17/2019 03:56   CT ANGIO NECK W OR WO CONTRAST  Result Date: 05/17/2019 CLINICAL DATA:  Follow-up examination for acute stroke. EXAM: CT ANGIOGRAPHY HEAD AND NECK TECHNIQUE: Multidetector CT imaging of the head and neck was performed using the standard protocol during bolus administration of intravenous contrast. Multiplanar CT image reconstructions and MIPs were obtained to evaluate the vascular anatomy. Carotid stenosis measurements (when applicable) are obtained utilizing NASCET criteria, using the distal internal carotid diameter as the denominator. CONTRAST:  OMNIPAQUE IOHEXOL 350 MG/ML SOLN COMPARISON:  Prior brain MRI from 05/16/2019. FINDINGS: CT  HEAD FINDINGS Brain: Mildly advanced cerebral atrophy for age. Advanced chronic microvascular ischemic disease with multiple remote lacunar infarcts clustered about the deep gray nuclei and pons, better seen on prior brain MRI. Previously identified small strokes not visible by CT. No intracranial hemorrhage. No mass lesion, midline shift or mass effect. No hydrocephalus or extra-axial fluid collection. Vascular: No appreciable hyperdense vessel, better evaluated on concomitant CTA. Scattered atherosclerotic change at the skull base. Skull: Scalp soft tissues and calvarium within normal limits.  Sinuses: Extensive acute on chronic pan sinusitis as previously described again noted. Mastoid air cells remain clear. Orbits: Globes and orbital soft tissues demonstrate no acute finding. Review of the MIP images confirms the above findings CTA NECK FINDINGS Aortic arch: Visualized arch of normal caliber with normal branch pattern. No hemodynamically significant stenosis seen about the origin of the great vessels. Visualized subclavian arteries widely patent. Right carotid system: Right common carotid artery patent from its origin to the bifurcation without stenosis. Mild eccentric calcified plaque at the proximal right ICA without stenosis. Right ICA widely patent distally without stenosis, dissection or occlusion. Left carotid system: Left CCA patent from its origin to the bifurcation without stenosis. Scattered calcified plaque about the left bifurcation/proximal left ICA with associated stenosis of up to approximately 45-50% by NASCET criteria. Left ICA otherwise patent distally to the skull base without stenosis, dissection, or occlusion. Vertebral arteries: Both vertebral arteries arise from the subclavian arteries. Left vertebral artery dominant, with a diffusely hypoplastic right vertebral artery. Vertebral body res widely patent within the neck without stenosis or other abnormality. Skeleton: No acute osseous finding. No discrete or worrisome osseous lesions. Poor dentition with innumerable dental caries and periapical lucencies noted. Other neck: No other acute soft tissue abnormality within the neck. No mass lesion or adenopathy. Upper chest: Visualized upper chest demonstrates no acute finding. Review of the MIP images confirms the above findings CTA HEAD FINDINGS Anterior circulation: Petrous segments patent bilaterally. Scattered atheromatous irregularity within the cavernous/supraclinoid ICAs bilaterally, left slightly greater than right. Short-segment moderate stenosis seen involving the anterior  genu of the cavernous left ICA (series 11, image 225). No significant narrowing about the right carotid siphon. ICA termini well perfused. A1 segments patent bilaterally. Normal anterior communicating artery complex. Right A2 segment widely patent to its distal aspect. Short-segment severe stenosis seen involving the mid left A2 segment (series 14, image 49). No M1 stenosis or occlusion. Negative MCA bifurcations. Distal MCA branches well perfused and symmetric. Posterior circulation: Dominant left vertebral artery irregular but patent to the vertebrobasilar junction without stenosis. Hypoplastic right vertebral artery largely terminates in PICA, and is either occluded or severely stenotic distally. Vertebrobasilar junction and proximal basilar artery are irregular with a dolichoectatic appearance. The mid and distal basilar artery are diminutive but patent. Superior cerebral arteries grossly patent bilaterally. Predominant fetal type origin of both PCAs. PCAs grossly patent to their distal aspects without stenosis. Venous sinuses: Grossly patent allowing for timing the contrast bolus. Anatomic variants: Predominant fetal type origin of both PCAs. Review of the MIP images confirms the above findings IMPRESSION: CT HEAD IMPRESSION: 1. Advanced chronic microvascular ischemic disease for age with multiple remote lacunar infarcts involving the deep gray nuclei and pons, better characterized on recent brain MRI. Recently identified subcentimeter infarcts not visible by CT. 2. No other new acute intracranial abnormality. 3. Acute on chronic pansinusitis. CTA HEAD AND NECK IMPRESSION: 1. Negative CTA for large vessel occlusion. 2. Irregular dolichoectatic appearance of the vertebrobasilar junction and proximal  basilar artery. The mid and distal basilar artery are diminutive but patent, with predominant fetal type origin of both PCAs. 3. Hypoplastic right vertebral artery largely terminates in PICA, with either occlusion or  severe stenosis distally to the vertebrobasilar junction. Dominant left vertebral artery patent without high-grade stenosis. 4. Atheromatous plaque about the left carotid bifurcation with estimated 45-50% stenosis by NASCET criteria. 5. Short-segment severe mid left A2 stenosis. Electronically Signed   By: Rise Mu M.D.   On: 05/17/2019 03:56   CT CHEST WO CONTRAST  Result Date: 05/17/2019 CLINICAL DATA:  Lung nodule.  Abnormal chest x-ray. EXAM: CT CHEST WITHOUT CONTRAST TECHNIQUE: Multidetector CT imaging of the chest was performed following the standard protocol without IV contrast. COMPARISON:  Two-view chest x-ray 05/07/2019 FINDINGS: Cardiovascular: Heart size is normal. Left mainstem coronary artery calcification is present. Minimal calcifications are present at the aortic arch. No significant stenosis is present. No aneurysm is present. Pulmonary artery size is normal. Mediastinum/Nodes: No significant mediastinal, hilar, or axillary adenopathy present. Lungs/Pleura: Are clear without focal nodule, mass, or airspace disease. Upper Abdomen: Punctate nonobstructing stone is present in the right kidney. Water density 16 mm exophytic lesion in the left kidney likely represents a simple cyst. No other focal lesions are present. Musculoskeletal: Prominent syndesmophyte on the right at C5-6 corresponds to the density seen on the chest x-ray. Other focal osseous lesions are present. Vertebral body heights are maintained. IMPRESSION: 1. Prominent right-sided syndesmophyte corresponds to the density seen on the chest x-ray. No follow-up needed. 2. Calcification at the level of the left main coronary artery. Recommend cardiology consultation. 3. No pulmonary nodule. Electronically Signed   By: Marin Roberts M.D.   On: 05/17/2019 07:57   MR Brain Wo Contrast (neuro protocol)  Result Date: 05/16/2019 CLINICAL DATA:  Initial evaluation for acute dizziness. EXAM: MRI HEAD WITHOUT CONTRAST  TECHNIQUE: Multiplanar, multiecho pulse sequences of the brain and surrounding structures were obtained without intravenous contrast. COMPARISON:  Prior head CT from 05/08/2019. FINDINGS: Brain: Generalized age-related cerebral atrophy. Advanced chronic small vessel ischemic disease with innumerable remote lacunar infarcts seen clustered about the bilateral basal ganglia, thalami, and pons. Few additional tiny remote bilateral cerebellar infarcts noted. Superimposed scattered multiple foci of susceptibility artifact consistent with chronic microhemorrhages, likely related to poorly controlled hypertension. 6 mm focus of restricted diffusion involving the right parietal cortex consistent with a small acute ischemic infarct (series 5, image 78). No associated hemorrhage or mass effect. There is an additional punctate focus of diffusion abnormality involving the dorsal brainstem at the pontomedullary junction (series 5, image 59), which could reflect an additional small acute to subacute ischemic infarct. Difficult to be certain this finding given the adjacent chronic micro hemorrhages in susceptibility artifact. No associated acute hemorrhage or mass effect. No other evidence for acute or subacute ischemia. Gray-white matter differentiation otherwise maintained. No mass lesion, midline shift, or mass effect. No hydrocephalus or extra-axial fluid collection. Pituitary gland suprasellar region within normal limits. Midline structures intact. Vascular: Right vertebral artery hypoplastic with loss of normal flow void, likely occluded. Probable atherosclerotic change about the vertebrobasilar junction and proximal basilar artery. Major intracranial vascular flow voids otherwise maintained. Skull and upper cervical spine: Craniocervical junction within normal limits. Bone marrow signal intensity normal. No scalp soft tissue abnormality. Sinuses/Orbits: Globes and orbital soft tissues within normal limits. Extensive mucosal  thickening in opacity seen throughout the paranasal sinuses, greatest within the frontoethmoidal sinuses. Few scattered superimposed air-fluid levels noted within the sphenoid sinuses.  No mastoid effusion. Inner ear structures grossly normal. Other: None. IMPRESSION: 1. 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 2. Additional punctate focus of diffusion abnormality involving the dorsal brainstem at the pontomedullary junction, which could reflect an acute to early subacute ischemic infarct or possibly artifact. Difficult to be certain given adjacent susceptibility artifact within this region. 3. 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 4. Multiple superimposed chronic micro hemorrhages clustered about the deep gray nuclei and brainstem, likely related to poorly controlled hypertension. 5. Acute on chronic pansinusitis, likely allergic/inflammatory in nature. Electronically Signed   By: Rise MuBenjamin  McClintock M.D.   On: 05/16/2019 23:49        Scheduled Meds: . aspirin  300 mg Rectal Daily   Or  . aspirin  325 mg Oral Daily  . atorvastatin  20 mg Oral Daily  . carvedilol  3.125 mg Oral BID WC  . enoxaparin (LOVENOX) injection  40 mg Subcutaneous Q24H  . folic acid  1 mg Oral Daily  . multivitamin with minerals  1 tablet Oral Daily  . thiamine  100 mg Oral Daily   Or  . thiamine  100 mg Intravenous Daily   Continuous Infusions: . sodium chloride 75 mL/hr at 05/17/19 0843     LOS: 0 days   Time spent: 25min  Azucena FallenWilliam C Dailey Alberson, DO [facility]  If 7PM-7AM, please contact night-coverage [email or web address]  05/17/2019, 1:30 PM

## 2019-05-17 NOTE — Consult Note (Addendum)
Cardiology Consultation:   Patient ID: Jarquavious Fentress; [id number]; 06/22/1975   Admit date: 05/16/2019 Date of Consult: 05/17/2019  Primary Care Provider: Marcine Matar, MD Primary Cardiologist: No primary care provider on file. Primary Electrophysiologist:  None  Patient Profile:   Jonael Kubicki is a 44 y.o. male with a PMH of HTN, HLD, tobacco abuse though recently quit smoking, and ETOH abuse, though has not had any alcohol in 3-4 weeks, who is being seen today for the evaluation of HTN at the request of Dr. Blake Divine.  History of Present Illness:   Mr. Guia was in his usual state of health until ~1 month ago when he began experiencing intermittent dizziness, numbness/tighness in the right upper/lower extremities, and speech difficulties. He presented to the ED 05/07/19 with elevated blood pressures (192/128 at that visit) and noted the right sided decreased sensation complaints. He had a CT Head 05/07/19 which revealed chronic small vessel disease greater than expected for age with multiple old small infarcts. Recommended for an MRI at that time which was deferred to the outpatient setting. He was started on amlodipine  daily at that time. Symptoms acutely worsened 05/16/19, prompting him to come back to the ED for further evaluation. BP again markedly elevated. 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 Neurology was consulted and felt the acute CVA was unlikely to cause his right sided deficits, and more likely this is chronic from prior infarcts. He was recommended for an echo which is pending, and BP management with long term goal of  120/80. Cardiology asked to evaluate to assist with BP management.  At the time of this evaluation his only complaint is continued right sided numbness. 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 He doesn't do any formal exercise but does household chores without anginal complaints. He denies any exertional chest pain. He has some DOE which he attributes to his tobacco abuse history, which has not changed in intensity in recent months. He reports quitting tobacco and ETOH ~1 month ago, for which he was congratulated. No complaints of headache, blurry vision, or syncope. He denies palpitations, LE edema, orthopnea, or PND.    Past Medical History:  Diagnosis Date   Hypertension     History reviewed. No pertinent surgical history.   Home Medications:  Prior to Admission medications   Medication Sig Start Date End Date Taking? Authorizing Provider  amLODipine (NORVASC) 10 MG tablet Take 1 tablet (10 mg total) by mouth daily. 05/10/19  Yes Marcine Matar, MD  aspirin EC 81 MG tablet Take 1 tablet (81 mg total) by mouth daily. 05/10/19  Yes Marcine Matar, MD  atorvastatin (LIPITOR) 20 MG tablet Take 1 tablet (20 mg total) by mouth daily. 05/11/19  Yes Marcine Matar, MD  lisinopril (ZESTRIL) 10 MG tablet Take 1 tablet (10 mg total) by mouth daily. 05/10/19  Yes Marcine Matar, MD  varenicline (CHANTIX) 0.5 MG tablet Take 1 tablet (0.5 mg total) by mouth 2 (two) times daily. For smoking cessation 05/10/19   Marcine Matar, MD    Inpatient Medications: Scheduled Meds:  aspirin  300 mg Rectal Daily   Or   aspirin  325 mg Oral Daily   atorvastatin  20 mg Oral Daily   enoxaparin (LOVENOX) injection  40 mg Subcutaneous Q24H   folic acid  1 mg Oral Daily   multivitamin with minerals  1 tablet Oral Daily   thiamine  100 mg Oral Daily   Or   thiamine  100 mg Intravenous Daily   Continuous Infusions:  sodium chloride 75 mL/hr at 05/17/19 0843   PRN  Meds: acetaminophen **OR** acetaminophen (TYLENOL) oral liquid 160 mg/5 mL **OR** acetaminophen, hydrALAZINE, labetalol  Allergies:   No Known Allergies  Social History:   Social History   Socioeconomic History   Marital status: Married    Spouse name: Not on file   Number of children: 2   Years of education: Not on file   Highest education level: 9th grade  Occupational History   Occupation: unemployed  Tobacco Use   Smoking status: Current Some Day Smoker    Packs/day: 0.25    Years: 20.00    Pack years: 5.00   Smokeless tobacco: Never Used  Substance and Sexual Activity   Alcohol use: Yes    Alcohol/week: 14.0 - 21.0 standard drinks    Types: 14 - 21 Cans of beer per week   Drug use: Not on file   Sexual activity: Not on file  Other Topics Concern   Not on file  Social History Narrative   Not on file   Social Determinants of Health   Financial Resource Strain:    Difficulty of Paying Living Expenses:   Food Insecurity:    Worried About Programme researcher, broadcasting/film/video in the Last Year:    Barista in the Last Year:   Transportation Needs:    Freight forwarder (Medical):    Lack of Transportation (Non-Medical):   Physical Activity:    Days of Exercise per Week:    Minutes of Exercise per Session:   Stress:    Feeling of Stress :   Social Connections:    Frequency of Communication with Friends and Family:    Frequency of Social Gatherings with Friends and Family:    Attends Religious Services:    Active Member of Clubs or Organizations:    Attends Engineer, structural:    Marital Status:   Intimate Partner Violence:    Fear of Current or Ex-Partner:    Emotionally Abused:    Physically Abused:    Sexually Abused:     Family History:    Family History  Problem Relation Age of Onset   Hypertension Father      ROS:  Please see the history of present illness.  ROS  All other ROS reviewed and negative.     Physical Exam/Data:   Vitals:    05/17/19 0630 05/17/19 0830 05/17/19 0900 05/17/19 1000  BP: (!) 188/116 (!) 183/112 (!) 188/116 (!) 170/113  Pulse: 72  (!) 109 87  Resp: 20  (!) 22 (!) 23  Temp:      TempSrc:      SpO2: 96%  97% 96%    Intake/Output Summary (Last 24 hours) at 05/17/2019 1122 Last data filed at 05/17/2019 0111 Gross per 24 hour  Intake 1000 ml  Output --  Net 1000 ml   There were no vitals filed for this visit. There is no height or weight on file to calculate BMI.  General:  Well nourished, well developed, in no acute distress HEENT: sclera anicteric, poor dentition Neck: no JVD  Vascular: No carotid bruits; distal pulses 2+ bilaterally Cardiac:  normal S1, S2; RRR; no murmurs, rubs, or gallops Lungs:  clear to auscultation bilaterally, no wheezing, rhonchi or rales  Abd: NABS, soft, nontender, no hepatomegaly Ext: no edema Musculoskeletal:  No deformities, BUE and BLE strength normal and equal Skin: warm and dry  Neuro:  CNs 2-12 intact, no focal abnormalities noted Psych:  Normal affect   EKG:  The EKG was personally reviewed and demonstrates:  Sinus rhythm, rate 77, LVH with repolarization changes, non-specific ST-T wave abnormalities; no comparison available.  Telemetry:  Telemetry was personally reviewed and demonstrates:  Sinus rhythm  Relevant CV Studies: Echo pending  Laboratory Data:  Chemistry Recent Labs  Lab 05/16/19 2004 05/17/19 0101 05/17/19 0617  NA 140  --  137  K 4.2  --  3.6  CL 103  --  104  CO2 26  --  24  GLUCOSE 116*  --  100*  BUN 13  --  9  CREATININE 0.85 0.52* 0.72  CALCIUM 9.3  --  9.1  GFRNONAA >60 >60 >60  GFRAA >60 >60 >60  ANIONGAP 11  --  9    Recent Labs  Lab 05/10/19 1606 05/16/19 2004 05/17/19 0617  PROT 7.6 8.0 7.1  ALBUMIN 4.8 4.3 3.8  AST ALT ALKPHOS 81 67 60  BILITOT 0.8 1.0 1.4*   Hematology Recent Labs  Lab 05/16/19 2004 05/17/19 0101 05/17/19 0617  WBC 10.6* 10.3 10.0  RBC 5.70 5.41 5.55  HGB  17.5* 16.6 16.9  HCT 53.2* 49.7 51.1  MCV 93.3 91.9 92.1  MCH 30.7 30.7 30.5  MCHC 32.9 33.4 33.1  RDW 12.8 13.0 12.9  PLT 342 339 321   Cardiac EnzymesNo results for input(s): TROPONINI in the last 168 hours. No results for input(s): TROPIPOC in the last 168 hours.  BNPNo results for input(s): BNP, PROBNP in the last 168 hours.  DDimer No results for input(s): DDIMER in the last 168 hours.  Radiology/Studies:  CT ANGIO HEAD W OR WO CONTRAST  Result Date: 05/17/2019 CLINICAL DATA:  Follow-up examination for acute stroke. EXAM: CT ANGIOGRAPHY HEAD AND NECK TECHNIQUE: Multidetector CT imaging of the head and neck was performed using the standard protocol during bolus administration of intravenous contrast. Multiplanar CT image reconstructions and MIPs were obtained to evaluate the vascular anatomy. Carotid stenosis measurements (when applicable) are obtained utilizing NASCET criteria, using the distal internal carotid diameter as the denominator. CONTRAST:  OMNIPAQUE IOHEXOL 350 MG/ML SOLN COMPARISON:  Prior brain MRI from 05/16/2019. FINDINGS: CT HEAD FINDINGS Brain: Mildly advanced cerebral atrophy for age. Advanced chronic microvascular ischemic disease with multiple remote lacunar infarcts clustered about the deep gray nuclei and pons, better seen on prior brain MRI. Previously identified small strokes not visible by CT. No intracranial hemorrhage. No mass lesion, midline shift or mass effect. No hydrocephalus or extra-axial fluid collection. Vascular: No appreciable hyperdense vessel, better evaluated on concomitant CTA. Scattered atherosclerotic change at the skull base. Skull: Scalp soft tissues and calvarium within normal limits. Sinuses: Extensive acute on chronic pan sinusitis as previously described again noted. Mastoid air cells remain clear. Orbits: Globes and orbital soft tissues demonstrate no acute finding. Review of the MIP images confirms the above findings CTA NECK FINDINGS  Aortic arch: Visualized arch of normal caliber with normal branch pattern. No hemodynamically significant stenosis seen about the origin of the great vessels. Visualized subclavian arteries widely patent. Right  carotid system: Right common carotid artery patent from its origin to the bifurcation without stenosis. Mild eccentric calcified plaque at the proximal right ICA without stenosis. Right ICA widely patent distally without stenosis, dissection or occlusion. Left carotid system: Left CCA patent from its origin to the bifurcation without stenosis. Scattered calcified plaque about the left bifurcation/proximal left ICA with associated stenosis of up to approximately 45-50% by NASCET criteria. Left ICA otherwise patent distally to the skull base without stenosis, dissection, or occlusion. Vertebral arteries: Both vertebral arteries arise from the subclavian arteries. Left vertebral artery dominant, with a diffusely hypoplastic right vertebral artery. Vertebral body res widely patent within the neck without stenosis or other abnormality. Skeleton: No acute osseous finding. No discrete or worrisome osseous lesions. Poor dentition with innumerable dental caries and periapical lucencies noted. Other neck: No other acute soft tissue abnormality within the neck. No mass lesion or adenopathy. Upper chest: Visualized upper chest demonstrates no acute finding. Review of the MIP images confirms the above findings CTA HEAD FINDINGS Anterior circulation: Petrous segments patent bilaterally. Scattered atheromatous irregularity within the cavernous/supraclinoid ICAs bilaterally, left slightly greater than right. Short-segment moderate stenosis seen involving the anterior genu of the cavernous left ICA (series 11, image 225). No significant narrowing about the right carotid siphon. ICA termini well perfused. A1 segments patent bilaterally. Normal anterior communicating artery complex. Right A2 segment widely patent to its distal  aspect. Short-segment severe stenosis seen involving the mid left A2 segment (series 14, image 49). No M1 stenosis or occlusion. Negative MCA bifurcations. Distal MCA branches well perfused and symmetric. Posterior circulation: Dominant left vertebral artery irregular but patent to the vertebrobasilar junction without stenosis. Hypoplastic right vertebral artery largely terminates in PICA, and is either occluded or severely stenotic distally. Vertebrobasilar junction and proximal basilar artery are irregular with a dolichoectatic appearance. The mid and distal basilar artery are diminutive but patent. Superior cerebral arteries grossly patent bilaterally. Predominant fetal type origin of both PCAs. PCAs grossly patent to their distal aspects without stenosis. Venous sinuses: Grossly patent allowing for timing the contrast bolus. Anatomic variants: Predominant fetal type origin of both PCAs. Review of the MIP images confirms the above findings IMPRESSION: CT HEAD IMPRESSION: 1. Advanced chronic microvascular ischemic disease for age with multiple remote lacunar infarcts involving the deep gray nuclei and pons, better characterized on recent brain MRI. Recently identified subcentimeter infarcts not visible by CT. 2. No other new acute intracranial abnormality. 3. Acute on chronic pansinusitis. CTA HEAD AND NECK IMPRESSION: 1. Negative CTA for large vessel occlusion. 2. 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 The mid and distal basilar artery are diminutive but patent, with predominant fetal type origin of both PCAs. 3. 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 Dominant left vertebral artery patent without high-grade stenosis. 4. Atheromatous plaque about the left carotid bifurcation with estimated 45-50% stenosis by NASCET criteria. 5. Short-segment severe mid  left A2 stenosis. Electronically Signed   By: Rise Mu M.D.   On: 05/17/2019 03:56   CT ANGIO NECK W OR WO CONTRAST  Result Date: 05/17/2019 CLINICAL DATA:  Follow-up examination for acute stroke. EXAM: CT ANGIOGRAPHY HEAD AND NECK TECHNIQUE: Multidetector CT imaging of the head and neck was performed using the standard protocol during bolus administration of intravenous contrast. Multiplanar CT image reconstructions and MIPs were obtained to evaluate the vascular anatomy. Carotid stenosis measurements (when applicable) are obtained  utilizing NASCET criteria, using the distal internal carotid diameter as the denominator. CONTRAST:  100mL OMNIPAQUE IOHEXOL 350 MG/ML SOLN COMPARISON:  Prior brain MRI from 05/16/2019. FINDINGS: CT HEAD FINDINGS Brain: Mildly advanced cerebral atrophy for age. Advanced chronic microvascular ischemic disease with multiple remote lacunar infarcts clustered about the deep gray nuclei and pons, better seen on prior brain MRI. Previously identified small strokes not visible by CT. No intracranial hemorrhage. No mass lesion, midline shift or mass effect. No hydrocephalus or extra-axial fluid collection. Vascular: No appreciable hyperdense vessel, better evaluated on concomitant CTA. Scattered atherosclerotic change at the skull base. Skull: Scalp soft tissues and calvarium within normal limits. Sinuses: Extensive acute on chronic pan sinusitis as previously described again noted. Mastoid air cells remain clear. Orbits: Globes and orbital soft tissues demonstrate no acute finding. Review of the MIP images confirms the above findings CTA NECK FINDINGS Aortic arch: Visualized arch of normal caliber with normal branch pattern. No hemodynamically significant stenosis seen about the origin of the great vessels. Visualized subclavian arteries widely patent. Right carotid system: Right common carotid artery patent from its origin to the bifurcation without stenosis. Mild eccentric  calcified plaque at the proximal right ICA without stenosis. Right ICA widely patent distally without stenosis, dissection or occlusion. Left carotid system: Left CCA patent from its origin to the bifurcation without stenosis. Scattered calcified plaque about the left bifurcation/proximal left ICA with associated stenosis of up to approximately 45-50% by NASCET criteria. Left ICA otherwise patent distally to the skull base without stenosis, dissection, or occlusion. Vertebral arteries: Both vertebral arteries arise from the subclavian arteries. Left vertebral artery dominant, with a diffusely hypoplastic right vertebral artery. Vertebral body res widely patent within the neck without stenosis or other abnormality. Skeleton: No acute osseous finding. No discrete or worrisome osseous lesions. Poor dentition with innumerable dental caries and periapical lucencies noted. Other neck: No other acute soft tissue abnormality within the neck. No mass lesion or adenopathy. Upper chest: Visualized upper chest demonstrates no acute finding. Review of the MIP images confirms the above findings CTA HEAD FINDINGS Anterior circulation: Petrous segments patent bilaterally. Scattered atheromatous irregularity within the cavernous/supraclinoid ICAs bilaterally, left slightly greater than right. Short-segment moderate stenosis seen involving the anterior genu of the cavernous left ICA (series 11, image 225). No significant narrowing about the right carotid siphon. ICA termini well perfused. A1 segments patent bilaterally. Normal anterior communicating artery complex. Right A2 segment widely patent to its distal aspect. Short-segment severe stenosis seen involving the mid left A2 segment (series 14, image 49). No M1 stenosis or occlusion. Negative MCA bifurcations. Distal MCA branches well perfused and symmetric. Posterior circulation: Dominant left vertebral artery irregular but patent to the vertebrobasilar junction without stenosis.  Hypoplastic right vertebral artery largely terminates in PICA, and is either occluded or severely stenotic distally. Vertebrobasilar junction and proximal basilar artery are irregular with a dolichoectatic appearance. The mid and distal basilar artery are diminutive but patent. Superior cerebral arteries grossly patent bilaterally. Predominant fetal type origin of both PCAs. PCAs grossly patent to their distal aspects without stenosis. Venous sinuses: Grossly patent allowing for timing the contrast bolus. Anatomic variants: Predominant fetal type origin of both PCAs. Review of the MIP images confirms the above findings IMPRESSION: CT HEAD IMPRESSION: 1. Advanced chronic microvascular ischemic disease for age with multiple remote lacunar infarcts involving the deep gray nuclei and pons, better characterized on recent brain MRI. Recently identified subcentimeter infarcts not visible by CT. 2. No other new acute  intracranial abnormality. 3. Acute on chronic pansinusitis. CTA HEAD AND NECK IMPRESSION: 1. Negative CTA for large vessel occlusion. 2. 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 The mid and distal basilar artery are diminutive but patent, with predominant fetal type origin of both PCAs. 3. 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 Dominant left vertebral artery patent without high-grade stenosis. 4. Atheromatous plaque about the left carotid bifurcation with estimated 45-50% stenosis by NASCET criteria. 5. Short-segment severe mid left A2 stenosis. Electronically Signed   By: Jeannine Boga M.D.   On: 05/17/2019 03:56   CT CHEST WO CONTRAST  Result Date: 05/17/2019 CLINICAL DATA:  Lung nodule.  Abnormal chest x-ray. EXAM: CT CHEST WITHOUT CONTRAST TECHNIQUE: Multidetector CT imaging of the chest was performed following the standard protocol  without IV contrast. COMPARISON:  Two-view chest x-ray 05/07/2019 FINDINGS: Cardiovascular: Heart size is normal. Left mainstem coronary artery calcification is present. Minimal calcifications are present at the aortic arch. No significant stenosis is present. No aneurysm is present. Pulmonary artery size is normal. Mediastinum/Nodes: No significant mediastinal, hilar, or axillary adenopathy present. Lungs/Pleura: Are clear without focal nodule, mass, or airspace disease. Upper Abdomen: Punctate nonobstructing stone is present in the right kidney. Water density 16 mm exophytic lesion in the left kidney likely represents a simple cyst. No other focal lesions are present. Musculoskeletal: Prominent syndesmophyte on the right at C5-6 corresponds to the density seen on the chest x-ray. Other focal osseous lesions are present. Vertebral body heights are maintained. IMPRESSION: 1. Prominent right-sided syndesmophyte corresponds to the density seen on the chest x-ray. No follow-up needed. 2. Calcification at the level of the left main coronary artery. Recommend cardiology consultation. 3. No pulmonary nodule. Electronically Signed   By: San Morelle M.D.   On: 05/17/2019 07:57   MR Brain Wo Contrast (neuro protocol)  Result Date: 05/16/2019 CLINICAL DATA:  Initial evaluation for acute dizziness. EXAM: MRI HEAD WITHOUT CONTRAST TECHNIQUE: Multiplanar, multiecho pulse sequences of the brain and surrounding structures were obtained without intravenous contrast. COMPARISON:  Prior head CT from 05/08/2019. FINDINGS: Brain: Generalized age-related cerebral atrophy. Advanced chronic small vessel ischemic disease with innumerable remote lacunar infarcts seen clustered about the bilateral basal ganglia, thalami, and pons. Few additional tiny remote bilateral cerebellar infarcts noted. Superimposed scattered multiple foci of susceptibility artifact consistent with chronic microhemorrhages, likely related to poorly  controlled hypertension. 6 mm focus of restricted diffusion involving the right parietal cortex consistent with a small acute ischemic infarct (series 5, image 78). No associated hemorrhage or mass effect. There is an additional punctate focus of diffusion abnormality involving the dorsal brainstem at the pontomedullary junction (series 5, image 59), which could reflect an additional small acute to subacute ischemic infarct. Difficult to be certain this finding given the adjacent chronic micro hemorrhages in susceptibility artifact. No associated acute hemorrhage or mass effect. No other evidence for acute or subacute ischemia. Gray-white matter differentiation otherwise maintained. No mass lesion, midline shift, or mass effect. No hydrocephalus or extra-axial fluid collection. Pituitary gland suprasellar region within normal limits. Midline structures intact. Vascular: Right vertebral artery hypoplastic with loss of normal flow void, likely occluded. Probable atherosclerotic change about the vertebrobasilar junction and proximal basilar artery. Major intracranial vascular flow voids otherwise maintained. Skull and upper cervical spine: Craniocervical junction within normal limits. Bone marrow signal intensity normal. No scalp soft tissue abnormality. Sinuses/Orbits: Globes and orbital soft tissues  within normal limits. Extensive mucosal thickening in opacity seen throughout the paranasal sinuses, greatest within the frontoethmoidal sinuses. Few scattered superimposed air-fluid levels noted within the sphenoid sinuses. No mastoid effusion. Inner ear structures grossly normal. Other: None. IMPRESSION: 1. 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 2. Additional punctate focus of diffusion abnormality involving the dorsal brainstem at the pontomedullary junction, which could reflect an acute to early subacute ischemic infarct or possibly artifact. Difficult to be certain  given adjacent susceptibility artifact within this region. 3. 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 4. Multiple superimposed chronic micro hemorrhages clustered about the deep gray nuclei and brainstem, likely related to poorly controlled hypertension. 5. Acute on chronic pansinusitis, likely allergic/inflammatory in nature. Electronically Signed   By: Rise Mu M.D.   On: 05/16/2019 23:49    Assessment and Plan:   1. Hypertensive emergency: BP markedly elevated. Now with acute CVA on MRI with evidence of prior infarcts and chronic microvascular ischemic changes, markedly advance for his age. Allowing for permissive HTN in the setting of acute stroke, though there is still room for improvement. His home amlodipine and lisinopril are on hold. - Will start carvedilol 3.125mg  BID with hold parameters for SBP <150 - Can continue prn labetalol/hydralazine for markedly elevated BP's  2. 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 MRI findings are quite impressive for his age. Neurology following.  - Echo pending - Continue aspirin and statin  3. Coronary artery calcifications on CT scan: patient underwent a CT chest to further evaluate a pulmonary nodule on CXR - benign on CT chest, however there was mention of calcifications at the level of the left main coronary artery with recommendations for a cardiology consult. He has no anginal complaints. LDL 63 and A1C 5.5 this admission. - Will need an ischemic work up after recovery of his CVA - timing and modality TBD - Will follow-up echocardiogram results - Continue aspirin and statin (LDL well controlled) - Continue aggressive BP management above   4. HLD: LDL 63 this admission - Continue atorvastatin   For questions or updates, please contact [facility] Please consult [email or web address] for  contact info under Cardiology/STEMI.   Signed, Beatriz Stallion, PA-C  05/17/2019 11:22 AM [id number]  Pt see and examined   I agree with findings of K Kroeger above Pt is an unfort 44  yo who represents to ED now with severely elevated HTN and new neurologic complaints.   CT with acute infarcts as noted  CT of chest showed signifi calcification of left main coronary artery Pt denies symptoms of CP or SOB  On exam:   Pt is in NAD   Laying flat Neck:  JVP is normal  No bruits Lungs are CTA  no rales Cardiac RRR   No S3  No definite S4  No murmurs Abd is supple  No masses Ext:   No LE edema  Good distal pulses    Pt being transferred to [facility] for further care by neurology Agree with plans for gentle control of BP  No abrupt large drops Will need further evaluation of LM stenosis after recovers from acute period of CVA  COnsider CT angiogram with possible FFR Continue statin.  Dietrich Pates MD

## 2019-05-17 NOTE — Evaluation (Signed)
Occupational Therapy Evaluation Patient Details Name: Victor Davies MRN: [id number] DOB: 1975-09-20 Today's Date: 05/17/2019    History of Present Illness 44 y.o. male with history of tobacco abuse, hypertension had a history of alcohol abuse patient states he has not had any alcohol for last 3 to 4 weeks has been experiencing increasing dizziness over the last 4 weeks.  Also has some numbness and tightness of the right upper and lower extremities over the same period of time last 4 weeks.   Clinical Impression   PTA, pt was living with his wife and two children (37 yo and 50mo old) and performing ADLs and simple IADLs; wife performing majority of IADLs including home management, money management, and medication management. Pt currently requiring Min guard-Min A for ADLs and mobility. Pt presenting with decreased balance, body awareness, and cognition. Pt will require further acute OT to facilitate safe dc. Due to pt's age and deficits, recommend dc to CIR for intensive OT to reach a Mod I level and return home safely.    Follow Up Recommendations  CIR;Supervision/Assistance - 24 hour(May progress to home with neuro OP OT)    Equipment Recommendations  None recommended by OT    Recommendations for Other Services PT consult     Precautions / Restrictions Precautions Precautions: Fall Restrictions Weight Bearing Restrictions: No      Mobility Bed Mobility Overal bed mobility: Independent                Transfers Overall transfer level: Needs assistance Equipment used: None Transfers: Sit to/from Stand Sit to Stand: Min guard         General transfer comment: Min guard A for safety    Balance Overall balance assessment: Needs assistance Sitting-balance support: No upper extremity supported;Feet supported Sitting balance-Leahy Scale: Good Sitting balance - Comments: supervision   Standing balance support: Bilateral upper extremity supported Standing  balance-Leahy Scale: Fair Standing balance comment: BUE support of IV pole with minG-close supervision                           ADL either performed or assessed with clinical judgement   ADL Overall ADL's : Needs assistance/impaired Eating/Feeding: Set up;Sitting   Grooming: Oral care;Minimal assistance;Min guard;Standing   Upper Body Bathing: Set up;Supervision/ safety;Sitting   Lower Body Bathing: Minimal assistance;Sit to/from stand   Upper Body Dressing : Set up;Supervision/safety;Sitting   Lower Body Dressing: Minimal assistance;Sit to/from stand   Toilet Transfer: Ambulation;Min guard;Minimal Dentist Details (indicate cue type and reason): Without Ue support, pt requiring Min A for balance         Functional mobility during ADLs: Minimal assistance General ADL Comments: Pt presenting with decreased balance, body awareness, and cognition     Vision         Perception     Praxis      Pertinent Vitals/Pain Pain Assessment: Faces Faces Pain Scale: Hurts even more Pain Location: RUE and RLE Pain Descriptors / Indicators: Tingling Pain Intervention(s): Monitored during session     Hand Dominance Right   Extremity/Trunk Assessment Upper Extremity Assessment Upper Extremity Assessment: RUE deficits/detail RUE Deficits / Details: WFL strength. Decreased coordination and reporting "I can't do these things because of all my problems." Pt then stating his hand isn't doing what he wants. Reports stinging pain running down his arm and to his middle digit.  RUE Sensation: (tingling in entire RUE) RUE Coordination: decreased fine motor;decreased gross motor(impaired  finger to nose)   Lower Extremity Assessment Lower Extremity Assessment: Defer to PT evaluation RLE Deficits / Details: tingling down entire RLE   Cervical / Trunk Assessment Cervical / Trunk Assessment: Normal   Communication Communication Communication: No difficulties    Cognition Arousal/Alertness: Awake/alert Behavior During Therapy: WFL for tasks assessed/performed Overall Cognitive Status: Impaired/Different from baseline Area of Impairment: Awareness;Following commands;Problem solving;Memory;Attention;Orientation;Safety/judgement                 Orientation Level: Disoriented to;Time Current Attention Level: Focused;Sustained Memory: Decreased short-term memory Following Commands: Follows one step commands consistently;Follows multi-step commands with increased time Safety/Judgement: Decreased awareness of safety;Decreased awareness of deficits   Problem Solving: Slow processing;Requires verbal cues General Comments: Pt with poor attention and quickly becoming distracted. Pt with poor following of commands and requring increased cues and time. Poor body awareness.    General Comments  HR elevating to 135    Exercises     Shoulder Instructions      Home Living Family/patient expects to be discharged to:: Private residence Living Arrangements: Spouse/significant other;Children Available Help at Discharge: Family;Available PRN/intermittently(spouse works 8-5, SIL's can assist PRN) Type of Home: Apartment(town home apartment) Home Access: Level entry     Home Layout: Two level Alternate Level Stairs-Number of Steps: 20 Alternate Level Stairs-Rails: Right Bathroom Shower/Tub: Teacher, early years/pre: Standard     Home Equipment: None      Lives With: Spouse    Prior Functioning/Environment Level of Independence: Independent        Comments: Pt performs all ADLs. Wife does all IADLs including medication management and money management. 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        OT Problem List: Decreased strength;Decreased activity tolerance;Impaired balance (sitting and/or standing);Decreased knowledge of use of DME or AE;Decreased knowledge of precautions;Decreased cognition       OT Treatment/Interventions: Self-care/ADL training;Therapeutic exercise;Energy conservation;DME and/or AE instruction;Therapeutic activities;Patient/family education    OT Goals(Current goals can be found in the care plan section) Acute Rehab OT Goals Patient Stated Goal: To return to independence to care for children again OT Goal Formulation: With patient Time For Goal Achievement: 05/31/19 Potential to Achieve Goals: Good  OT Frequency: Min 2X/week   Barriers to D/C:            Co-evaluation              AM-PAC OT "6 Clicks" Daily Activity     Outcome Measure Help from another person eating meals?: A Little Help from another person taking care of personal grooming?: A Little Help from another person toileting, which includes using toliet, bedpan, or urinal?: A Little Help from another person bathing (including washing, rinsing, drying)?: A Little Help from another person to put on and taking off regular upper body clothing?: A Little Help from another person to put on and taking off regular lower body clothing?: A Little 6 Click Score: 18   End of Session Nurse Communication: Mobility status  Activity Tolerance: Patient tolerated treatment well Patient left: in chair;with call bell/phone within reach;with chair alarm set;with family/visitor present  OT Visit Diagnosis: Unsteadiness on feet (R26.81);Other abnormalities of gait and mobility (R26.89);Muscle weakness (generalized) (M62.81);Other (comment)(Dizziness)                Time: 2951-8841 OT Time Calculation (min): 17 min Charges:  OT General Charges $OT Visit: 1 Visit OT Evaluation $OT Eval Moderate  Complexity: 1 Mod  Ronnesha Mester MSOT, OTR/L Acute Rehab Pager: [phone] Office: [phone]  Theodoro Grist Mallie Giambra 05/17/2019, 5:23 PM

## 2019-05-17 NOTE — ED Notes (Signed)
Spoke with MD Blake Divine regarding pts Chest CT results.  She will follow up with cardiology.

## 2019-05-17 NOTE — ED Notes (Signed)
Pt provided lunch tray. Informed of bed assignment at [facility].

## 2019-05-17 NOTE — Evaluation (Signed)
Physical Therapy Evaluation Patient Details Name: Victor Davies MRN: [id number] DOB: 10-Apr-1975 Today's Date: 05/17/2019   History of Present Illness  44 y.o. male with history of tobacco abuse, hypertension had a history of alcohol abuse patient states he has not had any alcohol for last 3 to 4 weeks has been experiencing increasing dizziness over the last 4 weeks.  Also has some numbness and tightness of the right upper and lower extremities over the same period of time last 4 weeks.  Clinical Impression  Pt presents to PT with deficits in sensation, coordination, balance, gait, mobility, vision, cognition. Pt is very unsteady when mobilizing without UE support, requiring physical assistance to prevent falls. Pt requiring at least unilateral UE support to maintain balance, although still demonstrating gait impairments even with UE support of IV pole. Pt also demonstrating impaired coordination in RUE throughout session and reports impaired sensation in R side, increasing his falls risk. 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    Follow Up Recommendations CIR;Supervision for mobility/OOB    Equipment Recommendations  (TBD, cane vs RW)    Recommendations for Other Services       Precautions / Restrictions Precautions Precautions: Fall Restrictions Weight Bearing Restrictions: No      Mobility  Bed Mobility Overal bed mobility: Independent                Transfers Overall transfer level: Needs assistance Equipment used: None Transfers: Sit to/from Stand Sit to Stand: Supervision            Ambulation/Gait Ambulation/Gait assistance: Mod assist;Min assist Gait Distance (Feet): 20 Feet(20' wihout device, 100' with IV  pole) Assistive device: IV Pole;None Gait Pattern/deviations: Step-to pattern;Drifts right/left;Staggering right;Staggering left Gait velocity: reduced Gait velocity interpretation: <1.8 ft/sec, indicate of risk for recurrent falls General Gait Details: pt requiring modA for ambulation without UE support due to multiple lateral LOB requriing PT support to correct. With BUE support of IV pole pt ambulates at minG level for 50', progressing to unilateral UE support for an additional 50'. Pt reports dizziness contributing to balance deficits  Stairs            Wheelchair Mobility    Modified Rankin (Stroke Patients Only) Modified Rankin (Stroke Patients Only) Pre-Morbid Rankin Score: No significant disability Modified Rankin: Moderately severe disability     Balance Overall balance assessment: Needs assistance Sitting-balance support: No upper extremity supported;Feet supported Sitting balance-Leahy Scale: Good Sitting balance - Comments: supervision   Standing balance support: Bilateral upper extremity supported Standing balance-Leahy Scale: Fair Standing balance comment: BUE support of IV pole with minG-close supervision                             Pertinent Vitals/Pain Pain Assessment: Faces Faces Pain Scale: Hurts even more Pain Location: RUE and RLE Pain Descriptors / Indicators: Tingling Pain Intervention(s): Monitored during session    Home Living Family/patient expects to be discharged to:: Private residence Living Arrangements: Spouse/significant other;Children Available Help at Discharge: Family;Available PRN/intermittently(spouse works 8-5, SIL's can assist PRN) Type of Home: Apartment(town home apartment) Home Access: Level entry     Home Layout: Two level Home Equipment: None      Prior Function Level of Independence:  Independent         Comments: Pt performs all ADLs. Wife does all IADLs including medication management and money  management. 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     Hand Dominance   Dominant Hand: Right    Extremity/Trunk Assessment   Upper Extremity Assessment Upper Extremity Assessment: RUE deficits/detail RUE Sensation: (tingling in entire RUE) RUE Coordination: (impaired finger to nose)    Lower Extremity Assessment Lower Extremity Assessment: RLE deficits/detail RLE Deficits / Details: tingling down entire RLE    Cervical / Trunk Assessment Cervical / Trunk Assessment: Normal  Communication   Communication: No difficulties  Cognition Arousal/Alertness: Awake/alert Behavior During Therapy: WFL for tasks assessed/performed Overall Cognitive Status: Impaired/Different from baseline Area of Impairment: Awareness;Following commands;Problem solving;Memory;Attention;Orientation;Safety/judgement                 Orientation Level: Disoriented to;Time Current Attention Level: Focused;Sustained Memory: Decreased short-term memory Following Commands: Follows one step commands consistently;Follows multi-step commands with increased time Safety/Judgement: Decreased awareness of safety;Decreased awareness of deficits   Problem Solving: Slow processing;Requires verbal cues        General Comments General comments (skin integrity, edema, etc.): pt tachy into 130s during session, reports dizziness with all standing and OOB mobility.    Exercises     Assessment/Plan    PT Assessment Patient needs continued PT services  PT Problem List Decreased balance;Decreased mobility;Decreased coordination;Decreased cognition;Decreased knowledge of use of DME;Decreased safety awareness;Decreased knowledge of precautions;Impaired sensation       PT Treatment Interventions DME instruction;Gait training;Stair training;Functional mobility training;Therapeutic exercise;Therapeutic activities;Balance training;Neuromuscular re-education;Patient/family education    PT  Goals (Current goals can be found in the Care Plan section)  Acute Rehab PT Goals Patient Stated Goal: To return to independence to care for children again PT Goal Formulation: With patient Time For Goal Achievement: 05/31/19 Potential to Achieve Goals: Good Additional Goals Additional Goal #1: Pt will maintain dynamic standing balance within 10 inches of his base of support without UE support, independently.    Frequency Min 4X/week   Barriers to discharge        Co-evaluation               AM-PAC PT "6 Clicks" Mobility  Outcome Measure Help needed turning from your back to your side while in a flat bed without using bedrails?: None Help needed moving from lying on your back to sitting on the side of a flat bed without using bedrails?: None Help needed moving to and from a bed to a chair (including a wheelchair)?: A Little Help needed standing up from a chair using your arms (e.g., wheelchair or bedside chair)?: A Little Help needed to walk in hospital room?: A Little Help needed climbing 3-5 steps with a railing? : A Lot 6 Click Score: 19    End of Session   Activity Tolerance: Patient tolerated treatment well Patient left: in chair;with call bell/phone within reach;with chair alarm set;with family/visitor present Nurse Communication: Mobility status PT Visit Diagnosis: Unsteadiness on feet (R26.81);Other symptoms and signs involving the nervous system (R29.898)    Time: 9381-0175 PT Time Calculation (min) (ACUTE ONLY): 18 min   Charges:   PT Evaluation $PT Eval Moderate Complexity: 1 Mod          Zenaida Niece, PT, DPT Acute Rehabilitation Pager: [phone]   Zenaida Niece 05/17/2019, 5:00 PM

## 2019-05-17 NOTE — Progress Notes (Signed)
Received report from W/L ED RN

## 2019-05-17 NOTE — ED Notes (Signed)
[facility] called. 

## 2019-05-17 NOTE — Progress Notes (Signed)
Patient arrived to 3w-34 at 1300

## 2019-05-17 NOTE — Progress Notes (Signed)
  Speech Language Pathology Treatment: Cognitive-Linquistic  Patient Details Name: Victor Davies MRN: [id number] DOB: 1975/09/21 Today's Date: 05/17/2019 Time: 7673-4193 SLP Time Calculation (min) (ACUTE ONLY): 24 min  Assessment / Plan / Recommendation Clinical Impression  Pt was seen for cognitive-linguistic treatment and was cooperative throughout the session. He demonstrated 60% accuracy with delayed recall when semantic cues were given. He demonstrated 100% accuracy with problem solving related to safety but required increased support for other problem solving. He achieved 33% accuracy with mental manipulation sequencing tasks increasing to 100% with verbal prompts and repetition. He required intermittent verbal prompts to accurately provide all the steps necessary for cooking a dish. He completed abstract reasoning tasks with 67% accuracy given verbal prompts. Completion of a medication management task was deferred since the pt's wife indicated that the she manages the pt's medications. SLP will continue to follow pt.    HPI HPI: Pt is a 44 y.o. male with history of tobacco abuse, hypertension had a history of alcohol abuse patient states he has not had any alcohol for last 3 to 4 weeks. He presented to the ED secondary to increasing dizziness, numbness and tightness of the right upper and lower extremities for 4 weeks. MRI brain: 6 mm acute ischemic nonhemorrhagic cortical infarct involving the right parietal lobe. Additional punctate focus of diffusion abnormality involving the dorsal brainstem at the pontomedullary junction, which could reflect an acute to early subacute ischemic infarct or possibly artifact.      SLP Plan  Continue with current plan of care  Patient needs continued Speech Lanaguage Pathology Services    Recommendations                   Follow up Recommendations: (SLP services at level of care recommended by PT/OT) SLP Visit Diagnosis: Cognitive  communication deficit (R41.841);Dysarthria and anarthria (R47.1) Plan: Continue with current plan of care       Tanisia Yokley I. Vear Clock, MS, CCC-SLP Acute Rehabilitation Services Office number [phone] Pager [phone]                 Scheryl Marten 05/17/2019, 5:21 PM

## 2019-05-17 NOTE — Consult Note (Signed)
Referring Physician: Dr. Blake Divine    Chief Complaint: N/V and dizziness  HPI: Victor Davies is a right-handed 44 y.o. male who presented to the ED yesterday evening with acute onset of N/V and dizziness. He has a history of HTN and has not seen a doctor in a while. He denies prior history of stroke. He states that he just recently quit smoking.   He had recently been seen in the ED on 4/12-13 for high BP readings at home in conjunction with a 2 week history of right arm and leg numbness, in addition to difficulty writing with his right hand. CT head at that time revealed chronic small vessel disease greater than expected for age, with multiple old small vessel infarcts. An MRI was recommended, but patient elected for discharge from the ED with outpatient follow up. He was seen in clinic on 4/15 and wa started on Norvasc. He was found to have an elevated cholesterol level.    At this return ED visit, MRI was obtained revealing multiple chronic lacunar infarctions and 2 new punctate acute ischemic infarctions, one in the brainstem and one in the right parietal lobe. He was not a tPA candidate due to completed punctate acute infarctions on MRI.   CTA of head and neck were then obtained, revealing no LVO, but with atheromatous and stenotic findings.   CTA HEAD AND NECK IMPRESSION:  1. Negative CTA for large vessel occlusion. 2. 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 The mid and distal basilar artery are diminutive but patent, with predominant fetal type origin of both PCAs. 3. 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 Dominant left vertebral artery patent without high-grade stenosis. 4. Atheromatous plaque about the left carotid bifurcation with estimated 45-50% stenosis by NASCET criteria. 5. Short-segment severe mid left A2  stenosis.  Past Medical History:  Diagnosis Date  . Hypertension     History reviewed. No pertinent surgical history.  Family History  Problem Relation Age of Onset  . Hypertension Father    Social History:  reports that he has been smoking. He has a 5.00 pack-year smoking history. He has never used smokeless tobacco. He reports current alcohol use of about 14.0 - 21.0 standard drinks of alcohol per week. No history on file for drug.  Allergies: No Known Allergies  Medications:  Prior to Admission:  Norvasc  Inpatient Scheduled: . aspirin  300 mg Rectal Daily   Or  . aspirin  325 mg Oral Daily  . atorvastatin  20 mg Oral Daily  . enoxaparin (LOVENOX) injection  40 mg Subcutaneous Q24H  . folic acid  1 mg Oral Daily  . multivitamin with minerals  1 tablet Oral Daily  . thiamine  100 mg Oral Daily   Or  . thiamine  100 mg Intravenous Daily    ROS: No headache, seizure, pain with urination, syncope, visual changes, trouble talking or fever. He has had a dry cough for 2 months. Has had some chest pressure. Other ROS as per HPI.    Physical Examination: Blood pressure (!) 188/116, pulse 72, temperature 97.6 F (36.4 C), temperature source Oral, resp. rate 20, SpO2 96 %.  HEENT: [place]/AT Lungs: Respirations unlabored Ext: No edema  Neurologic Examination: Mental Status: Alert, oriented to the day, but not month or year. Oriented to the city and state. Dysthymic affect. Speech fluent with intact naming and comprehension.   Cranial Nerves:  II:  Visual fields intact with no extinction to DSS. PERRL.  III,IV, VI: No ptosis. EOMI with saccadic pursuits noted. No nystagmus.  V,VII: Mild right facial droop. Temp sensation equal bilaterally  VIII: hearing intact to voice IX,X: No hypophonia XI: Symmetric  XII: midline tongue extension Motor: Right : Upper extremity   5/5    Left:     Upper extremity   5/5  Lower extremity   5/5     Lower extremity   4+/5 No pronator drift.   Sensory: Temp and light touch intact x 4 Deep Tendon Reflexes:  2+ bilateral brachioradialis and biceps 3+ right patella, 4+ left patella 1+ bilateral achilles Plantars: Right: downgoing   Left: equivocal Cerebellar: Mild dysmetria with FNF bilaterally, somewhat more prominent on the right.  Gait: Deferred   Results for orders placed or performed during the hospital encounter of 05/16/19 (from the past 48 hour(s))  Lipase, blood     Status: None   Collection Time: 05/16/19  8:04 PM  Result Value Ref Range   Lipase 22 11 - 51 U/L    Comment: Performed at [facility], [address] [place], [place] [address]  Comprehensive metabolic panel     Status: Abnormal   Collection Time: 05/16/19  8:04 PM  Result Value Ref Range   Sodium 140 135 - 145 mmol/L   Potassium 4.2 3.5 - 5.1 mmol/L   Chloride 103 98 - 111 mmol/L   CO2 26 22 - 32 mmol/L   Glucose, Bld 116 (H) 70 - 99 mg/dL    Comment: Glucose reference range applies only to samples taken after fasting for at least 8 hours.   BUN 13 6 - 20 mg/dL   Creatinine, Ser 5.28 0.61 - 1.24 mg/dL   Calcium 9.3 8.9 - 41.3 mg/dL   Total Protein 8.0 6.5 - 8.1 g/dL   Albumin 4.3 3.5 - 5.0 g/dL   AST 19 15 - 41 U/L   ALT 22 0 - 44 U/L   Alkaline Phosphatase 67 38 - 126 U/L   Total Bilirubin 1.0 0.3 - 1.2 mg/dL   GFR calc non Af Amer >60 >60 mL/min   GFR calc Af Amer >60 >60 mL/min   Anion gap 11 5 - 15    Comment: Performed at [facility], [address], [place], [place] [address]  CBC     Status: Abnormal   Collection Time: 05/16/19  8:04 PM  Result Value Ref Range   WBC 10.6 (H) 4.0 - 10.5 K/uL   RBC 5.70 4.22 - 5.81 MIL/uL   Hemoglobin 17.5 (H) 13.0 - 17.0 g/dL   HCT 02.7 (H) 25.3 - 66.4 %   MCV 93.3 80.0 - 100.0 fL   MCH 30.7 26.0 - 34.0 pg   MCHC 32.9 30.0 - 36.0 g/dL   RDW 40.3 47.4 - 25.9 %   Platelets 342 150 - 400 K/uL   nRBC 0.0 0.0 - 0.2 %    Comment: Performed at [facility], [address] [place], [place] [address]  Urinalysis, Routine w reflex microscopic     Status: Abnormal   Collection Time: 05/16/19  8:04 PM  Result Value Ref Range   Color, Urine YELLOW YELLOW   APPearance CLEAR CLEAR   Specific Gravity, Urine 1.015 1.005 - 1.030   pH 6.0 5.0 - 8.0   Glucose, UA 150 (A) NEGATIVE mg/dL   Hgb urine dipstick NEGATIVE NEGATIVE   Bilirubin  Urine NEGATIVE NEGATIVE   Ketones, ur 20 (A) NEGATIVE mg/dL   Protein, ur NEGATIVE NEGATIVE mg/dL   Nitrite NEGATIVE NEGATIVE   Leukocytes,Ua NEGATIVE NEGATIVE    Comment: Performed at [facility], [address] [place], [place] [address]  Urine rapid drug screen (hosp performed)     Status: Abnormal   Collection Time: 05/16/19  8:04 PM  Result Value Ref Range   Opiates NONE DETECTED NONE DETECTED   Cocaine NONE DETECTED NONE DETECTED   Benzodiazepines NONE DETECTED NONE DETECTED   Amphetamines NONE DETECTED NONE DETECTED   Tetrahydrocannabinol POSITIVE (A) NONE DETECTED   Barbiturates NONE DETECTED NONE DETECTED    Comment: (NOTE) DRUG SCREEN FOR MEDICAL PURPOSES ONLY.  IF CONFIRMATION IS NEEDED FOR ANY PURPOSE, NOTIFY LAB WITHIN 5 DAYS. LOWEST DETECTABLE LIMITS FOR URINE DRUG SCREEN Drug Class                     Cutoff (ng/mL) Amphetamine and metabolites    1000 Barbiturate and metabolites    200 Benzodiazepine                 200 Tricyclics and metabolites     300 Opiates and metabolites        300 Cocaine and metabolites        300 THC                            50 Performed at [facility], [address], [place], [place] [address]   Protime-INR     Status: None   Collection Time: 05/17/19 12:11 AM  Result Value Ref Range   Prothrombin Time 13.0 11.4 - 15.2 seconds   INR 1.0 0.8 - 1.2    Comment: (NOTE) INR goal varies based on device and disease states. Performed at [facility], [address], [place], [place] [address]   APTT     Status: None   Collection Time: 05/17/19 12:11 AM  Result Value Ref Range   aPTT 31 24 - 36 seconds    Comment: Performed at [facility], [address], [place], [place] [address]  Respiratory Panel by RT PCR (Flu A&B, Covid) - Nasopharyngeal Swab     Status: None   Collection Time: 05/17/19  1:01 AM   Specimen: Nasopharyngeal Swab  Result Value Ref Range   SARS Coronavirus 2 by RT PCR NEGATIVE NEGATIVE    Comment: (NOTE) SARS-CoV-2 target nucleic acids are NOT DETECTED. The SARS-CoV-2 RNA is generally detectable in upper respiratoy specimens during the acute phase of infection. The lowest concentration of SARS-CoV-2 viral copies this assay can detect is 131 copies/mL. A negative result does not preclude SARS-Cov-2 infection and should not be used as the sole basis for treatment or other patient management decisions. A negative result may occur with  improper specimen collection/handling, submission of specimen other than nasopharyngeal swab, presence of viral mutation(s) within the areas targeted by this assay, and inadequate number of viral copies (<131 copies/mL). A negative result must be combined with clinical observations, patient history, and epidemiological information. The expected result is Negative. Fact Sheet for Patients:  https://www.moore.com/ Fact Sheet for Healthcare Providers:  https://www.young.biz/ This test is not yet ap proved or cleared by the [place] FDA and  has been authorized for detection and/or diagnosis of SARS-CoV-2 by FDA under an Emergency Use Authorization (EUA). This EUA will remain  in effect (meaning this test can be used) for the duration of the COVID-19 declaration under Section 564(b)(1) of the Act, 21 U.S.C. section 360bbb-3(b)(1), unless the authorization is terminated or revoked sooner.    Influenza A by PCR NEGATIVE NEGATIVE    Influenza B by PCR NEGATIVE NEGATIVE    Comment: (NOTE) The Xpert Xpress SARS-CoV-2/FLU/RSV assay is intended as an aid in  the diagnosis of influenza from Nasopharyngeal swab specimens and  should not be used as a sole basis for treatment. Nasal washings and  aspirates are unacceptable for Xpert Xpress SARS-CoV-2/FLU/RSV  testing. Fact Sheet for Patients: https://www.moore.com/ Fact Sheet for Healthcare Providers: https://www.young.biz/ This test is not yet approved or cleared by the [place] FDA and  has been authorized for detection and/or diagnosis of SARS-CoV-2 by  FDA under an Emergency Use Authorization (EUA). This EUA will remain  in effect (meaning this test can be used) for the duration of the  Covid-19 declaration under Section 564(b)(1) of the Act, 21  U.S.C. section 360bbb-3(b)(1), unless the authorization is  terminated or revoked. Performed at [facility], [address], [place], [place] [address]   Hemoglobin A1c     Status: None   Collection Time: 05/17/19  1:01 AM  Result Value Ref Range   Hgb A1c MFr Bld 5.5 4.8 - 5.6 %    Comment: (NOTE) Pre diabetes:          5.7%-6.4% Diabetes:              >6.4% Glycemic control for   <7.0% adults with diabetes    Mean Plasma Glucose 111.15 mg/dL    Comment: Performed at [facility], [address], [place], [place] [address]  Lipid panel     Status: Abnormal   Collection Time: 05/17/19  1:01 AM  Result Value Ref Range   Cholesterol 105 0 - 200 mg/dL   Triglycerides 26 <102 mg/dL   HDL 37 (L) >58 mg/dL   Total CHOL/HDL Ratio 2.8 RATIO   VLDL 5 0 - 40 mg/dL   LDL Cholesterol 63 0 - 99 mg/dL    Comment:        Total Cholesterol/HDL:CHD Risk Coronary Heart Disease Risk Table                     Men   Women  1/2 Average Risk   3.4   3.3  Average Risk       5.0   4.4  2 X Average Risk   9.6   7.1  3 X Average Risk  23.4   11.0        Use the  calculated Patient Ratio above and the CHD Risk Table to determine the patient's CHD Risk.        ATP III CLASSIFICATION (LDL):  <100     mg/dL   Optimal  527-782  mg/dL   Near or Above                    Optimal  130-159  mg/dL   Borderline  423-536  mg/dL   High  >144     mg/dL   Very High Performed at [facility], [address], [place], [place] [address]   CBC     Status: None   Collection Time: 05/17/19  1:01 AM  Result Value Ref Range   WBC 10.3 4.0 - 10.5 K/uL   RBC 5.41 4.22 -  5.81 MIL/uL   Hemoglobin 16.6 13.0 - 17.0 g/dL   HCT 16.149.7 09.639.0 - 04.552.0 %   MCV 91.9 80.0 - 100.0 fL   MCH 30.7 26.0 - 34.0 pg   MCHC 33.4 30.0 - 36.0 g/dL   RDW 40.913.0 81.111.5 - 91.415.5 %   Platelets 339 150 - 400 K/uL   nRBC 0.0 0.0 - 0.2 %    Comment: Performed at [facility], [address] [place], [place] [address]  Creatinine, serum     Status: Abnormal   Collection Time: 05/17/19  1:01 AM  Result Value Ref Range   Creatinine, Ser 0.52 (L) 0.61 - 1.24 mg/dL   GFR calc non Af Amer >60 >60 mL/min   GFR calc Af Amer >60 >60 mL/min    Comment: Performed at [facility], [address] [place], [place] [address]  Comprehensive metabolic panel     Status: Abnormal   Collection Time: 05/17/19  6:17 AM  Result Value Ref Range   Sodium 137 135 - 145 mmol/L   Potassium 3.6 3.5 - 5.1 mmol/L   Chloride 104 98 - 111 mmol/L   CO2 24 22 - 32 mmol/L   Glucose, Bld 100 (H) 70 - 99 mg/dL    Comment: Glucose reference range applies only to samples taken after fasting for at least 8 hours.   BUN 9 6 - 20 mg/dL   Creatinine, Ser 8.650.72 0.61 - 1.24 mg/dL   Calcium 9.1 8.9 - 78.410.3 mg/dL   Total Protein 7.1 6.5 - 8.1 g/dL   Albumin 3.8 3.5 - 5.0 g/dL   AST 15 15 - 41 U/L   ALT 17 0 - 44 U/L   Alkaline Phosphatase 60 38 - 126 U/L   Total Bilirubin 1.4 (H) 0.3 - 1.2 mg/dL   GFR calc non Af Amer >60 >60 mL/min   GFR calc Af Amer >60 >60 mL/min   Anion  gap 9 5 - 15    Comment: Performed at [facility], [address] [place], [place] [address]  CBC with Differential/Platelet     Status: None   Collection Time: 05/17/19  6:17 AM  Result Value Ref Range   WBC 10.0 4.0 - 10.5 K/uL   RBC 5.55 4.22 - 5.81 MIL/uL   Hemoglobin 16.9 13.0 - 17.0 g/dL   HCT 52.851.1 41.339.0 - 24.452.0 %   MCV 92.1 80.0 - 100.0 fL   MCH 30.5 26.0 - 34.0 pg   MCHC 33.1 30.0 - 36.0 g/dL   RDW 01.012.9 27.211.5 - 53.615.5 %   Platelets 321 150 - 400 K/uL   nRBC 0.0 0.0 - 0.2 %   Neutrophils Relative % 68 %   Neutro Abs 6.7 1.7 - 7.7 K/uL   Lymphocytes Relative 24 %   Lymphs Abs 2.5 0.7 - 4.0 K/uL   Monocytes Relative 6 %   Monocytes Absolute 0.6 0.1 - 1.0 K/uL   Eosinophils Relative 2 %   Eosinophils Absolute 0.2 0.0 - 0.5 K/uL   Basophils Relative 0 %   Basophils Absolute 0.0 0.0 - 0.1 K/uL   Immature Granulocytes 0 %   Abs Immature Granulocytes 0.03 0.00 - 0.07 K/uL    Comment: Performed at [facility], [address] [place], [place] [address]   CT ANGIO HEAD W OR WO CONTRAST  Result Date: 05/17/2019 CLINICAL DATA:  Follow-up examination for acute stroke. EXAM: CT ANGIOGRAPHY HEAD AND NECK TECHNIQUE: Multidetector CT imaging  of the head and neck was performed using the standard protocol during bolus administration of intravenous contrast. Multiplanar CT image reconstructions and MIPs were obtained to evaluate the vascular anatomy. Carotid stenosis measurements (when applicable) are obtained utilizing NASCET criteria, using the distal internal carotid diameter as the denominator. CONTRAST:  139mL OMNIPAQUE IOHEXOL 350 MG/ML SOLN COMPARISON:  Prior brain MRI from 05/16/2019. FINDINGS: CT HEAD FINDINGS Brain: Mildly advanced cerebral atrophy for age. Advanced chronic microvascular ischemic disease with multiple remote lacunar infarcts clustered about the deep gray nuclei and pons, better seen on prior brain MRI. Previously identified small  strokes not visible by CT. No intracranial hemorrhage. No mass lesion, midline shift or mass effect. No hydrocephalus or extra-axial fluid collection. Vascular: No appreciable hyperdense vessel, better evaluated on concomitant CTA. Scattered atherosclerotic change at the skull base. Skull: Scalp soft tissues and calvarium within normal limits. Sinuses: Extensive acute on chronic pan sinusitis as previously described again noted. Mastoid air cells remain clear. Orbits: Globes and orbital soft tissues demonstrate no acute finding. Review of the MIP images confirms the above findings CTA NECK FINDINGS Aortic arch: Visualized arch of normal caliber with normal branch pattern. No hemodynamically significant stenosis seen about the origin of the great vessels. Visualized subclavian arteries widely patent. Right carotid system: Right common carotid artery patent from its origin to the bifurcation without stenosis. Mild eccentric calcified plaque at the proximal right ICA without stenosis. Right ICA widely patent distally without stenosis, dissection or occlusion. Left carotid system: Left CCA patent from its origin to the bifurcation without stenosis. Scattered calcified plaque about the left bifurcation/proximal left ICA with associated stenosis of up to approximately 45-50% by NASCET criteria. Left ICA otherwise patent distally to the skull base without stenosis, dissection, or occlusion. Vertebral arteries: Both vertebral arteries arise from the subclavian arteries. Left vertebral artery dominant, with a diffusely hypoplastic right vertebral artery. Vertebral body res widely patent within the neck without stenosis or other abnormality. Skeleton: No acute osseous finding. No discrete or worrisome osseous lesions. Poor dentition with innumerable dental caries and periapical lucencies noted. Other neck: No other acute soft tissue abnormality within the neck. No mass lesion or adenopathy. Upper chest: Visualized upper chest  demonstrates no acute finding. Review of the MIP images confirms the above findings CTA HEAD FINDINGS Anterior circulation: Petrous segments patent bilaterally. Scattered atheromatous irregularity within the cavernous/supraclinoid ICAs bilaterally, left slightly greater than right. Short-segment moderate stenosis seen involving the anterior genu of the cavernous left ICA (series 11, image 225). No significant narrowing about the right carotid siphon. ICA termini well perfused. A1 segments patent bilaterally. Normal anterior communicating artery complex. Right A2 segment widely patent to its distal aspect. Short-segment severe stenosis seen involving the mid left A2 segment (series 14, image 49). No M1 stenosis or occlusion. Negative MCA bifurcations. Distal MCA branches well perfused and symmetric. Posterior circulation: Dominant left vertebral artery irregular but patent to the vertebrobasilar junction without stenosis. Hypoplastic right vertebral artery largely terminates in PICA, and is either occluded or severely stenotic distally. Vertebrobasilar junction and proximal basilar artery are irregular with a dolichoectatic appearance. The mid and distal basilar artery are diminutive but patent. Superior cerebral arteries grossly patent bilaterally. Predominant fetal type origin of both PCAs. PCAs grossly patent to their distal aspects without stenosis. Venous sinuses: Grossly patent allowing for timing the contrast bolus. Anatomic variants: Predominant fetal type origin of both PCAs. Review of the MIP images confirms the above findings IMPRESSION: CT HEAD IMPRESSION: 1. Advanced  chronic microvascular ischemic disease for age with multiple remote lacunar infarcts involving the deep gray nuclei and pons, better characterized on recent brain MRI. Recently identified subcentimeter infarcts not visible by CT. 2. No other new acute intracranial abnormality. 3. Acute on chronic pansinusitis. CTA HEAD AND NECK IMPRESSION:  1. Negative CTA for large vessel occlusion. 2. 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 The mid and distal basilar artery are diminutive but patent, with predominant fetal type origin of both PCAs. 3. 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 Dominant left vertebral artery patent without high-grade stenosis. 4. Atheromatous plaque about the left carotid bifurcation with estimated 45-50% stenosis by NASCET criteria. 5. Short-segment severe mid left A2 stenosis. Electronically Signed   By: Rise Mu M.D.   On: 05/17/2019 03:56   CT ANGIO NECK W OR WO CONTRAST  Result Date: 05/17/2019 CLINICAL DATA:  Follow-up examination for acute stroke. EXAM: CT ANGIOGRAPHY HEAD AND NECK TECHNIQUE: Multidetector CT imaging of the head and neck was performed using the standard protocol during bolus administration of intravenous contrast. Multiplanar CT image reconstructions and MIPs were obtained to evaluate the vascular anatomy. Carotid stenosis measurements (when applicable) are obtained utilizing NASCET criteria, using the distal internal carotid diameter as the denominator. CONTRAST:  OMNIPAQUE IOHEXOL 350 MG/ML SOLN COMPARISON:  Prior brain MRI from 05/16/2019. FINDINGS: CT HEAD FINDINGS Brain: Mildly advanced cerebral atrophy for age. Advanced chronic microvascular ischemic disease with multiple remote lacunar infarcts clustered about the deep gray nuclei and pons, better seen on prior brain MRI. Previously identified small strokes not visible by CT. No intracranial hemorrhage. No mass lesion, midline shift or mass effect. No hydrocephalus or extra-axial fluid collection. Vascular: No appreciable hyperdense vessel, better evaluated on concomitant CTA. Scattered atherosclerotic change at the skull base. Skull: Scalp soft tissues and calvarium within  normal limits. Sinuses: Extensive acute on chronic pan sinusitis as previously described again noted. Mastoid air cells remain clear. Orbits: Globes and orbital soft tissues demonstrate no acute finding. Review of the MIP images confirms the above findings CTA NECK FINDINGS Aortic arch: Visualized arch of normal caliber with normal branch pattern. No hemodynamically significant stenosis seen about the origin of the great vessels. Visualized subclavian arteries widely patent. Right carotid system: Right common carotid artery patent from its origin to the bifurcation without stenosis. Mild eccentric calcified plaque at the proximal right ICA without stenosis. Right ICA widely patent distally without stenosis, dissection or occlusion. Left carotid system: Left CCA patent from its origin to the bifurcation without stenosis. Scattered calcified plaque about the left bifurcation/proximal left ICA with associated stenosis of up to approximately 45-50% by NASCET criteria. Left ICA otherwise patent distally to the skull base without stenosis, dissection, or occlusion. Vertebral arteries: Both vertebral arteries arise from the subclavian arteries. Left vertebral artery dominant, with a diffusely hypoplastic right vertebral artery. Vertebral body res widely patent within the neck without stenosis or other abnormality. Skeleton: No acute osseous finding. No discrete or worrisome osseous lesions. Poor dentition with innumerable dental caries and periapical lucencies noted. Other neck: No other acute soft tissue abnormality within the neck. No mass lesion or adenopathy. Upper chest: Visualized upper chest demonstrates no acute finding. Review of the MIP images confirms the above findings CTA HEAD FINDINGS Anterior circulation: Petrous segments patent bilaterally. Scattered atheromatous irregularity within the cavernous/supraclinoid ICAs bilaterally, left slightly greater than right. Short-segment moderate stenosis seen  involving  the anterior genu of the cavernous left ICA (series 11, image 225). No significant narrowing about the right carotid siphon. ICA termini well perfused. A1 segments patent bilaterally. Normal anterior communicating artery complex. Right A2 segment widely patent to its distal aspect. Short-segment severe stenosis seen involving the mid left A2 segment (series 14, image 49). No M1 stenosis or occlusion. Negative MCA bifurcations. Distal MCA branches well perfused and symmetric. Posterior circulation: Dominant left vertebral artery irregular but patent to the vertebrobasilar junction without stenosis. Hypoplastic right vertebral artery largely terminates in PICA, and is either occluded or severely stenotic distally. Vertebrobasilar junction and proximal basilar artery are irregular with a dolichoectatic appearance. The mid and distal basilar artery are diminutive but patent. Superior cerebral arteries grossly patent bilaterally. Predominant fetal type origin of both PCAs. PCAs grossly patent to their distal aspects without stenosis. Venous sinuses: Grossly patent allowing for timing the contrast bolus. Anatomic variants: Predominant fetal type origin of both PCAs. Review of the MIP images confirms the above findings IMPRESSION: CT HEAD IMPRESSION: 1. Advanced chronic microvascular ischemic disease for age with multiple remote lacunar infarcts involving the deep gray nuclei and pons, better characterized on recent brain MRI. Recently identified subcentimeter infarcts not visible by CT. 2. No other new acute intracranial abnormality. 3. Acute on chronic pansinusitis. CTA HEAD AND NECK IMPRESSION: 1. Negative CTA for large vessel occlusion. 2. 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 The mid and distal basilar artery are diminutive but patent, with predominant fetal type origin of both PCAs. 3. Hypoplastic right vertebral artery largely terminates in PICA, with either  occlusion or severe stenosis distally to the vertebrobasilar junction. Dominant left vertebral artery patent without high-grade stenosis. 4. Atheromatous plaque about the left carotid bifurcation with estimated 45-50% stenosis by NASCET criteria. 5. Short-segment severe mid left A2 stenosis. Electronically Signed   By: Rise Mu M.D.   On: 05/17/2019 03:56   MR Brain Wo Contrast (neuro protocol)  Result Date: 05/16/2019 CLINICAL DATA:  Initial evaluation for acute dizziness. EXAM: MRI HEAD WITHOUT CONTRAST TECHNIQUE: Multiplanar, multiecho pulse sequences of the brain and surrounding structures were obtained without intravenous contrast. COMPARISON:  Prior head CT from 05/08/2019. FINDINGS: Brain: Generalized age-related cerebral atrophy. Advanced chronic small vessel ischemic disease with innumerable remote lacunar infarcts seen clustered about the bilateral basal ganglia, thalami, and pons. Few additional tiny remote bilateral cerebellar infarcts noted. Superimposed scattered multiple foci of susceptibility artifact consistent with chronic microhemorrhages, likely related to poorly controlled hypertension. 6 mm focus of restricted diffusion involving the right parietal cortex consistent with a small acute ischemic infarct (series 5, image 78). No associated hemorrhage or mass effect. There is an additional punctate focus of diffusion abnormality involving the dorsal brainstem at the pontomedullary junction (series 5, image 59), which could reflect an additional small acute to subacute ischemic infarct. Difficult to be certain this finding given the adjacent chronic micro hemorrhages in susceptibility artifact. No associated acute hemorrhage or mass effect. No other evidence for acute or subacute ischemia. Gray-white matter differentiation otherwise maintained. No mass lesion, midline shift, or mass effect. No hydrocephalus or extra-axial fluid collection. Pituitary gland suprasellar region within  normal limits. Midline structures intact. Vascular: Right vertebral artery hypoplastic with loss of normal flow void, likely occluded. Probable atherosclerotic change about the vertebrobasilar junction and proximal basilar artery. Major intracranial vascular flow voids otherwise maintained. Skull and upper cervical spine: Craniocervical junction within normal limits. Bone marrow signal intensity normal. No  scalp soft tissue abnormality. Sinuses/Orbits: Globes and orbital soft tissues within normal limits. Extensive mucosal thickening in opacity seen throughout the paranasal sinuses, greatest within the frontoethmoidal sinuses. Few scattered superimposed air-fluid levels noted within the sphenoid sinuses. No mastoid effusion. Inner ear structures grossly normal. Other: None. IMPRESSION: 1. 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 2. Additional punctate focus of diffusion abnormality involving the dorsal brainstem at the pontomedullary junction, which could reflect an acute to early subacute ischemic infarct or possibly artifact. Difficult to be certain given adjacent susceptibility artifact within this region. 3. 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 4. Multiple superimposed chronic micro hemorrhages clustered about the deep gray nuclei and brainstem, likely related to poorly controlled hypertension. 5. Acute on chronic pansinusitis, likely allergic/inflammatory in nature. Electronically Signed   By: Rise Mu M.D.   On: 05/16/2019 23:49    Assessment: 44 y.o. male presenting with acute subcentimeter strokes, one in the pons and one in the right parietal lobe.  1. MRI brain also shows multiple old lacunar infarctions, most prominently in the pons, as well as chronic microhemorrhages in the deep grey nuclei and  brainstem.  2. Exam reveals multifocal UMN deficits, concordant with the MRI findings. The deficits are most likely chronic as the two new infarctions do not appear prominent enough or in the right locations to result in the deficits seen on exam. However, his acute presenting symptoms of N/V and dizziness yesterday are consistent with the location of the acute dorsal ponto-medullary ischemic infarction.  3. Stroke Risk Factors - HTN, longstanding smoking habit and evidence for prior lacunar infarctions on MRI  Recommendations: 1. HgbA1c, fasting lipid panel 2. Frequent neuro checks 3. PT consult, OT consult, Speech consult 4. TTE 5. Cardiac telemetry 6. Prophylactic therapy- Start ASA 81 mg po qd.  7. Risk factor modification 8. BP management. The acute lacunar infarction are too small to have a significant penumbra. BP goal long term should be 120/80. The patient's major issue is most likely longstanding uncontrolled HTN based upon the appearance of the lacunar infarctions and microhemorrhages on his MRI.  9. I have educated the patient on the importance of taking regular BP readings at home, remaining compliant with his medications, continuing to stay off cigarettes and to obtain 30 minutes of light exercise such as walking qd.      @Electronically  signed: Dr. 05/17/2019, 7:45 AM

## 2019-05-18 ENCOUNTER — Encounter (HOSPITAL_COMMUNITY): Payer: Self-pay | Admitting: Internal Medicine

## 2019-05-18 ENCOUNTER — Inpatient Hospital Stay (HOSPITAL_COMMUNITY): Payer: Medicaid Other

## 2019-05-18 DIAGNOSIS — I10 Essential (primary) hypertension: Secondary | ICD-10-CM

## 2019-05-18 DIAGNOSIS — F1210 Cannabis abuse, uncomplicated: Secondary | ICD-10-CM

## 2019-05-18 DIAGNOSIS — F1010 Alcohol abuse, uncomplicated: Secondary | ICD-10-CM

## 2019-05-18 DIAGNOSIS — I6389 Other cerebral infarction: Secondary | ICD-10-CM

## 2019-05-18 DIAGNOSIS — Z720 Tobacco use: Secondary | ICD-10-CM

## 2019-05-18 DIAGNOSIS — F1021 Alcohol dependence, in remission: Secondary | ICD-10-CM

## 2019-05-18 DIAGNOSIS — E782 Mixed hyperlipidemia: Secondary | ICD-10-CM

## 2019-05-18 DIAGNOSIS — I639 Cerebral infarction, unspecified: Secondary | ICD-10-CM

## 2019-05-18 DIAGNOSIS — E876 Hypokalemia: Secondary | ICD-10-CM

## 2019-05-18 LAB — BASIC METABOLIC PANEL
Anion gap: 11 (ref 5–15)
BUN: 8 mg/dL (ref 6–20)
CO2: 25 mmol/L (ref 22–32)
Calcium: 9.2 mg/dL (ref 8.9–10.3)
Chloride: 103 mmol/L (ref 98–111)
Creatinine, Ser: 0.71 mg/dL (ref 0.61–1.24)
GFR calc Af Amer: >60 mL/min (ref >60)
GFR calc non Af Amer: >60 mL/min (ref >60)
Glucose, Bld: 97 mg/dL (ref 70–99)
Potassium: 3.4 mmol/L — ABNORMAL LOW (ref 3.5–5.1)
Sodium: 139 mmol/L (ref 135–145)

## 2019-05-18 LAB — ECHOCARDIOGRAM COMPLETE
Height: 69 in
Weight: 2596.14 oz

## 2019-05-18 LAB — CBC
HCT: 49.6 % (ref 39.0–52.0)
Hemoglobin: 16.5 g/dL (ref 13.0–17.0)
MCH: 29.9 pg (ref 26.0–34.0)
MCHC: 33.3 g/dL (ref 30.0–36.0)
MCV: 90 fL (ref 80.0–100.0)
Platelets: 330 10*3/uL (ref 150–400)
RBC: 5.51 MIL/uL (ref 4.22–5.81)
RDW: 12.8 % (ref 11.5–15.5)
WBC: 8.1 10*3/uL (ref 4.0–10.5)
nRBC: 0 % (ref 0.0–0.2)

## 2019-05-18 LAB — TROPONIN I (HIGH SENSITIVITY)
Troponin I (High Sensitivity): 22 ng/L — ABNORMAL HIGH (ref <18)
Troponin I (High Sensitivity): 19 ng/L — ABNORMAL HIGH (ref <18)

## 2019-05-18 MED ORDER — ATORVASTATIN CALCIUM 80 MG PO TABS
80.0000 mg | ORAL_TABLET | Freq: Every day | ORAL | Status: DC
  Administered 2019-05-19: 80 mg via ORAL
  Filled 2019-05-18: qty 1

## 2019-05-18 MED ORDER — ASPIRIN EC 325 MG PO TBEC
325.0000 mg | DELAYED_RELEASE_TABLET | Freq: Every day | ORAL | Status: DC
  Administered 2019-05-18 – 2019-05-19 (×2): 325 mg via ORAL
  Filled 2019-05-18 (×2): qty 1

## 2019-05-18 MED ORDER — CLOPIDOGREL BISULFATE 75 MG PO TABS
75.0000 mg | ORAL_TABLET | Freq: Every day | ORAL | Status: DC
  Administered 2019-05-18 – 2019-05-19 (×2): 75 mg via ORAL
  Filled 2019-05-18 (×2): qty 1

## 2019-05-18 NOTE — Social Work (Signed)
CSW met with pt at bedside. CSW introduced self and explained her role. CSW completed sbirt with pt.  Pt scored a 1 on the sbirt scale. Pt stated he may have alcohol once a month or less. CSW inquired about substance use. Pt stated occasional THC use. Pt stated he does not have a problem with alcohol or THC use.   CSW offered resources and pt denied needing them  Emeterio Reeve, Latanya Presser, [place] Social Worker [phone]

## 2019-05-18 NOTE — Progress Notes (Signed)
PROGRESS NOTE    Victor Davies  ZOX:[id number] DOB: 1975-12-23 DOA: 05/16/2019 PCP: Marcine Matar, MD   Brief Narrative:  Victor Davies is a 44 y.o. male with history of tobacco abuse, hypertension had a history of alcohol abuse patient states he has not had any alcohol for last 3 to 4 weeks has been experiencing increasing dizziness over the last 4 weeks.  Also has some numbness and tightness of the right upper and lower extremities over the same period of time last 4 weeks.  He has recently followed up with his primary care physician for uncontrolled hypertension after the ER visit and lisinopril was added along with his Norvasc.  Patient's dizziness and nausea vomiting acutely worsened over the last 24 hours and presents to the ER.  Has some difficulty speaking.  Denies any weakness of the upper or lower extremities or any visual symptoms.In the ED MRI of the brain shows acute CVA, EKG shows normal sinus rhythm with nonspecific changes.  On exam patient is able to move all extremities 5 x 5.  On-call neurologist Dr. Otelia Limes was consulted.  Labs reveal normal metabolic panel with CBC showing WBC of 10.6 hemoglobin of 17.5.  Covid test was negative.  Urine drug screen is positive for marijuana.  Patient admitted for acute CVA.   Assessment & Plan:   Principal Problem:   Acute CVA (cerebrovascular accident) [facility]) Active Problems:   Essential hypertension   Acute ischemic stroke (HCC)   Tobacco abuse   Alcohol abuse   Hypokalemia   Marijuana abuse   Acute CVA, punctate infarct of the brainstem and right parietal lobe - MRI shows multiple chronic lacunar infarctions and 2 new punctate acute ischemic infarctions, one in the brainstem and one in the right parietal lobe - Echocardiogram completed without source of embolus, cardiology following for further evaluation - Neurology following, appreciate insight and recommendations -LDL 63, A1c 5.5, continue aspirin 325 with Plavix  daily for 3 months then transition to aspirin alone per neurology - PT/OT/SLP evaluation ongoing, currently being evaluated for CIR placement -Continue blood pressure control, uptitrate home medications including amlodipine, carvedilol, lisinopril as below Lab Results  Component Value Date   HGBA1C 5.5 05/17/2019   Lipid Panel     Component Value Date/Time   CHOL 105 05/17/2019 0101   CHOL 247 (H) 05/10/2019 1606   TRIG 26 05/17/2019 0101   HDL 37 (L) 05/17/2019 0101   HDL 69 05/10/2019 1606   CHOLHDL 2.8 05/17/2019 0101   VLDL 5 05/17/2019 0101   LDLCALC 63 05/17/2019 0101   LDLCALC 166 (H) 05/10/2019 1606   LABVLDL 12 05/10/2019 1606   Hypertensive emergency, resolving - In the setting of above - Tight control given neuro recommendations - no need for permissive hypertension - Continue home medications including amlodipine, carvedilol, lisinopril - Cardiology following, will follow along as we titrate up home medications to ensure adequate blood pressure control - Telemetry overnight concerning for ST elevation, repeat EKG shows LVH with early repolarization, patient remains without chest pain nausea vomiting diaphoresis, troponin essentially flat at 22, 19  Nausea/vomiting in the setting of above due to vertiginous symptoms -Patient indicates ongoing vertigo and dizziness with movement or position change likely in the setting of acute infarct as above - Continue supportive care  Hyperlipidemia  - Home rx: atorvastatin 20 - increased to  daily.  Tobacco abuse - On chantix - discussed cessation - Nicotine replacement offered  History of alcohol abuse  - patient states he  has not had any alcohol for the last 4 weeks. Follow clinically  DVT prophylaxis: Lovenox. Code Status: Full code. Family Communication: Patient's wife. Disposition Plan:  Patient currently being evaluated for CIR placement Consults called: Neurologist, cardiology Admission status: Inpatient,  continues to require further evaluation work-up with cardiology, neurology, being evaluated for CIR placement for ongoing physical therapy needs in the setting of acute stroke with ongoing vertiginous symptoms making ambulation unsafe.  Subjective: No acute issues/events to report, denies chest pain, headache, fevers, chills, constipation, diarrhea.  Objective: Vitals:   05/18/19 0342 05/18/19 0538 05/18/19 0905 05/18/19 1222  BP: (!) 158/109 (!) 177/115 (!) 167/105 (!) 152/109  Pulse: 76 70 78 74  Resp: 19 20 18 18   Temp: 98.2 F (36.8 C) 98.2 F (36.8 C) 97.8 F (36.6 C) 97.9 F (36.6 C)  TempSrc: Oral Oral Oral Oral  SpO2: 98% 100% 100% 100%  Weight:      Height:        Intake/Output Summary (Last 24 hours) at 05/18/2019 1514 Last data filed at 05/18/2019 1224 Gross per 24 hour  Intake 270 ml  Output 600 ml  Net -330 ml   Filed Weights   05/17/19 1533  Weight: 73.6 kg    Examination:  General exam: Appears calm and comfortable  Respiratory system: Clear to auscultation. Respiratory effort normal. Cardiovascular system: S1 & S2 heard, RRR. No JVD, murmurs, rubs, gallops or clicks. No pedal edema. Gastrointestinal system: Abdomen is nondistended, soft and nontender. No organomegaly or masses felt. Normal bowel sounds heard. Central nervous system: Alert and oriented. No focal neurological deficits. Extremities: Symmetric 5 x 5 power. Skin: No rashes, lesions or ulcers Psychiatry: Judgement and insight appear normal. Mood & affect appropriate.     Data Reviewed: I have personally reviewed following labs and imaging studies  CBC: Recent Labs  Lab 05/16/19 2004 05/17/19 0101 05/17/19 0617 05/18/19 0525  WBC 10.6* 10.3 10.0 8.1  NEUTROABS  --   --  6.7  --   HGB 17.5* 16.6 16.9 16.5  HCT 53.2* 49.7 51.1 49.6  MCV 93.3 91.9 92.1 90.0  PLT 342 339 321 330   Basic Metabolic Panel: Recent Labs  Lab 05/16/19 2004 05/17/19 0101 05/17/19 0617 05/18/19 0525    NA 140  --  137 139  K 4.2  --  3.6 3.4*  CL 103  --  104 103  CO2 26  --  24 25  GLUCOSE 116*  --  100* 97  BUN 13  --  9 8  CREATININE 0.85 0.52* 0.72 0.71  CALCIUM 9.3  --  9.1 9.2   GFR: Estimated Creatinine Clearance: 116.6 mL/min (by C-G formula based on SCr of 0.71 mg/dL). Liver Function Tests: Recent Labs  Lab 05/16/19 2004 05/17/19 0617  AST 19 15  ALT 22 17  ALKPHOS 67 60  BILITOT 1.0 1.4*  PROT 8.0 7.1  ALBUMIN 4.3 3.8   Recent Labs  Lab 05/16/19 2004  LIPASE 22   No results for input(s): AMMONIA in the last 168 hours. Coagulation Profile: Recent Labs  Lab 05/17/19 0011  INR 1.0   Cardiac Enzymes: No results for input(s): CKTOTAL, CKMB, CKMBINDEX, TROPONINI in the last 168 hours. BNP (last 3 results) No results for input(s): PROBNP in the last 8760 hours. HbA1C: Recent Labs    05/17/19 0101  HGBA1C 5.5   CBG: No results for input(s): GLUCAP in the last 168 hours. Lipid Profile: Recent Labs    05/17/19 0101  CHOL 105  HDL 37*  LDLCALC 63  TRIG 26  CHOLHDL 2.8   Thyroid Function Tests: No results for input(s): TSH, T4TOTAL, FREET4, T3FREE, THYROIDAB in the last 72 hours. Anemia Panel: No results for input(s): VITAMINB12, FOLATE, FERRITIN, TIBC, IRON, RETICCTPCT in the last 72 hours. Sepsis Labs: No results for input(s): PROCALCITON, LATICACIDVEN in the last 168 hours.  Recent Results (from the past 240 hour(s))  Respiratory Panel by RT PCR (Flu A&B, Covid) - Nasopharyngeal Swab     Status: None   Collection Time: 05/17/19  1:01 AM   Specimen: Nasopharyngeal Swab  Result Value Ref Range Status   SARS Coronavirus 2 by RT PCR NEGATIVE NEGATIVE Final    Comment: (NOTE) SARS-CoV-2 target nucleic acids are NOT DETECTED. The SARS-CoV-2 RNA is generally detectable in upper respiratoy specimens during the acute phase of infection. The lowest concentration of SARS-CoV-2 viral copies this assay can detect is 131 copies/mL. A negative result  does not preclude SARS-Cov-2 infection and should not be used as the sole basis for treatment or other patient management decisions. A negative result may occur with  improper specimen collection/handling, submission of specimen other than nasopharyngeal swab, presence of viral mutation(s) within the areas targeted by this assay, and inadequate number of viral copies (<131 copies/mL). A negative result must be combined with clinical observations, patient history, and epidemiological information. The expected result is Negative. Fact Sheet for Patients:  https://www.moore.com/ Fact Sheet for Healthcare Providers:  https://www.young.biz/ This test is not yet ap proved or cleared by the [place] FDA and  has been authorized for detection and/or diagnosis of SARS-CoV-2 by FDA under an Emergency Use Authorization (EUA). This EUA will remain  in effect (meaning this test can be used) for the duration of the COVID-19 declaration under Section 564(b)(1) of the Act, 21 U.S.C. section 360bbb-3(b)(1), unless the authorization is terminated or revoked sooner.    Influenza A by PCR NEGATIVE NEGATIVE Final   Influenza B by PCR NEGATIVE NEGATIVE Final    Comment: (NOTE) The Xpert Xpress SARS-CoV-2/FLU/RSV assay is intended as an aid in  the diagnosis of influenza from Nasopharyngeal swab specimens and  should not be used as a sole basis for treatment. Nasal washings and  aspirates are unacceptable for Xpert Xpress SARS-CoV-2/FLU/RSV  testing. Fact Sheet for Patients: https://www.moore.com/ Fact Sheet for Healthcare Providers: https://www.young.biz/ This test is not yet approved or cleared by the [place] FDA and  has been authorized for detection and/or diagnosis of SARS-CoV-2 by  FDA under an Emergency Use Authorization (EUA). This EUA will remain  in effect (meaning this test can be used) for the duration of  the  Covid-19 declaration under Section 564(b)(1) of the Act, 21  U.S.C. section 360bbb-3(b)(1), unless the authorization is  terminated or revoked. Performed at [facility], [address], [place], [place] [address]          Radiology Studies: CT ANGIO HEAD W OR WO CONTRAST  Result Date: 05/17/2019 CLINICAL DATA:  Follow-up examination for acute stroke. EXAM: CT ANGIOGRAPHY HEAD AND NECK TECHNIQUE: Multidetector CT imaging of the head and neck was performed using the standard protocol during bolus administration of intravenous contrast. Multiplanar CT image reconstructions and MIPs were obtained to evaluate the vascular anatomy. Carotid stenosis measurements (when applicable) are obtained utilizing NASCET criteria, using the distal internal carotid diameter as the denominator. CONTRAST:  OMNIPAQUE IOHEXOL 350 MG/ML SOLN COMPARISON:  Prior brain MRI from 05/16/2019. FINDINGS: CT HEAD FINDINGS  Brain: Mildly advanced cerebral atrophy for age. Advanced chronic microvascular ischemic disease with multiple remote lacunar infarcts clustered about the deep gray nuclei and pons, better seen on prior brain MRI. Previously identified small strokes not visible by CT. No intracranial hemorrhage. No mass lesion, midline shift or mass effect. No hydrocephalus or extra-axial fluid collection. Vascular: No appreciable hyperdense vessel, better evaluated on concomitant CTA. Scattered atherosclerotic change at the skull base. Skull: Scalp soft tissues and calvarium within normal limits. Sinuses: Extensive acute on chronic pan sinusitis as previously described again noted. Mastoid air cells remain clear. Orbits: Globes and orbital soft tissues demonstrate no acute finding. Review of the MIP images confirms the above findings CTA NECK FINDINGS Aortic arch: Visualized arch of normal caliber with normal branch pattern. No hemodynamically significant stenosis seen about the origin of the great  vessels. Visualized subclavian arteries widely patent. Right carotid system: Right common carotid artery patent from its origin to the bifurcation without stenosis. Mild eccentric calcified plaque at the proximal right ICA without stenosis. Right ICA widely patent distally without stenosis, dissection or occlusion. Left carotid system: Left CCA patent from its origin to the bifurcation without stenosis. Scattered calcified plaque about the left bifurcation/proximal left ICA with associated stenosis of up to approximately 45-50% by NASCET criteria. Left ICA otherwise patent distally to the skull base without stenosis, dissection, or occlusion. Vertebral arteries: Both vertebral arteries arise from the subclavian arteries. Left vertebral artery dominant, with a diffusely hypoplastic right vertebral artery. Vertebral body res widely patent within the neck without stenosis or other abnormality. Skeleton: No acute osseous finding. No discrete or worrisome osseous lesions. Poor dentition with innumerable dental caries and periapical lucencies noted. Other neck: No other acute soft tissue abnormality within the neck. No mass lesion or adenopathy. Upper chest: Visualized upper chest demonstrates no acute finding. Review of the MIP images confirms the above findings CTA HEAD FINDINGS Anterior circulation: Petrous segments patent bilaterally. Scattered atheromatous irregularity within the cavernous/supraclinoid ICAs bilaterally, left slightly greater than right. Short-segment moderate stenosis seen involving the anterior genu of the cavernous left ICA (series 11, image 225). No significant narrowing about the right carotid siphon. ICA termini well perfused. A1 segments patent bilaterally. Normal anterior communicating artery complex. Right A2 segment widely patent to its distal aspect. Short-segment severe stenosis seen involving the mid left A2 segment (series 14, image 49). No M1 stenosis or occlusion. Negative MCA  bifurcations. Distal MCA branches well perfused and symmetric. Posterior circulation: Dominant left vertebral artery irregular but patent to the vertebrobasilar junction without stenosis. Hypoplastic right vertebral artery largely terminates in PICA, and is either occluded or severely stenotic distally. Vertebrobasilar junction and proximal basilar artery are irregular with a dolichoectatic appearance. The mid and distal basilar artery are diminutive but patent. Superior cerebral arteries grossly patent bilaterally. Predominant fetal type origin of both PCAs. PCAs grossly patent to their distal aspects without stenosis. Venous sinuses: Grossly patent allowing for timing the contrast bolus. Anatomic variants: Predominant fetal type origin of both PCAs. Review of the MIP images confirms the above findings IMPRESSION: CT HEAD IMPRESSION: 1. Advanced chronic microvascular ischemic disease for age with multiple remote lacunar infarcts involving the deep gray nuclei and pons, better characterized on recent brain MRI. Recently identified subcentimeter infarcts not visible by CT. 2. No other new acute intracranial abnormality. 3. Acute on chronic pansinusitis. CTA HEAD AND NECK IMPRESSION: 1. Negative CTA for large vessel occlusion. 2. 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  The mid and distal basilar artery are diminutive but patent, with predominant fetal type origin of both PCAs. 3. 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 Dominant left vertebral artery patent without high-grade stenosis. 4. Atheromatous plaque about the left carotid bifurcation with estimated 45-50% stenosis by NASCET criteria. 5. Short-segment severe mid left A2 stenosis. Electronically Signed   By: Rise Mu M.D.   On: 05/17/2019 03:56   CT ANGIO NECK W OR WO CONTRAST  Result  Date: 05/17/2019 CLINICAL DATA:  Follow-up examination for acute stroke. EXAM: CT ANGIOGRAPHY HEAD AND NECK TECHNIQUE: Multidetector CT imaging of the head and neck was performed using the standard protocol during bolus administration of intravenous contrast. Multiplanar CT image reconstructions and MIPs were obtained to evaluate the vascular anatomy. Carotid stenosis measurements (when applicable) are obtained utilizing NASCET criteria, using the distal internal carotid diameter as the denominator. CONTRAST:  OMNIPAQUE IOHEXOL 350 MG/ML SOLN COMPARISON:  Prior brain MRI from 05/16/2019. FINDINGS: CT HEAD FINDINGS Brain: Mildly advanced cerebral atrophy for age. Advanced chronic microvascular ischemic disease with multiple remote lacunar infarcts clustered about the deep gray nuclei and pons, better seen on prior brain MRI. Previously identified small strokes not visible by CT. No intracranial hemorrhage. No mass lesion, midline shift or mass effect. No hydrocephalus or extra-axial fluid collection. Vascular: No appreciable hyperdense vessel, better evaluated on concomitant CTA. Scattered atherosclerotic change at the skull base. Skull: Scalp soft tissues and calvarium within normal limits. Sinuses: Extensive acute on chronic pan sinusitis as previously described again noted. Mastoid air cells remain clear. Orbits: Globes and orbital soft tissues demonstrate no acute finding. Review of the MIP images confirms the above findings CTA NECK FINDINGS Aortic arch: Visualized arch of normal caliber with normal branch pattern. No hemodynamically significant stenosis seen about the origin of the great vessels. Visualized subclavian arteries widely patent. Right carotid system: Right common carotid artery patent from its origin to the bifurcation without stenosis. Mild eccentric calcified plaque at the proximal right ICA without stenosis. Right ICA widely patent distally without stenosis, dissection or occlusion. Left  carotid system: Left CCA patent from its origin to the bifurcation without stenosis. Scattered calcified plaque about the left bifurcation/proximal left ICA with associated stenosis of up to approximately 45-50% by NASCET criteria. Left ICA otherwise patent distally to the skull base without stenosis, dissection, or occlusion. Vertebral arteries: Both vertebral arteries arise from the subclavian arteries. Left vertebral artery dominant, with a diffusely hypoplastic right vertebral artery. Vertebral body res widely patent within the neck without stenosis or other abnormality. Skeleton: No acute osseous finding. No discrete or worrisome osseous lesions. Poor dentition with innumerable dental caries and periapical lucencies noted. Other neck: No other acute soft tissue abnormality within the neck. No mass lesion or adenopathy. Upper chest: Visualized upper chest demonstrates no acute finding. Review of the MIP images confirms the above findings CTA HEAD FINDINGS Anterior circulation: Petrous segments patent bilaterally. Scattered atheromatous irregularity within the cavernous/supraclinoid ICAs bilaterally, left slightly greater than right. Short-segment moderate stenosis seen involving the anterior genu of the cavernous left ICA (series 11, image 225). No significant narrowing about the right carotid siphon. ICA termini well perfused. A1 segments patent bilaterally. Normal anterior communicating artery complex. Right A2 segment widely patent to its distal aspect. Short-segment severe stenosis seen involving the mid left A2 segment (series 14, image 49). No M1 stenosis or occlusion. Negative MCA bifurcations. Distal MCA branches  well perfused and symmetric. Posterior circulation: Dominant left vertebral artery irregular but patent to the vertebrobasilar junction without stenosis. Hypoplastic right vertebral artery largely terminates in PICA, and is either occluded or severely stenotic distally. Vertebrobasilar junction  and proximal basilar artery are irregular with a dolichoectatic appearance. The mid and distal basilar artery are diminutive but patent. Superior cerebral arteries grossly patent bilaterally. Predominant fetal type origin of both PCAs. PCAs grossly patent to their distal aspects without stenosis. Venous sinuses: Grossly patent allowing for timing the contrast bolus. Anatomic variants: Predominant fetal type origin of both PCAs. Review of the MIP images confirms the above findings IMPRESSION: CT HEAD IMPRESSION: 1. Advanced chronic microvascular ischemic disease for age with multiple remote lacunar infarcts involving the deep gray nuclei and pons, better characterized on recent brain MRI. Recently identified subcentimeter infarcts not visible by CT. 2. No other new acute intracranial abnormality. 3. Acute on chronic pansinusitis. CTA HEAD AND NECK IMPRESSION: 1. Negative CTA for large vessel occlusion. 2. 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 The mid and distal basilar artery are diminutive but patent, with predominant fetal type origin of both PCAs. 3. 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 Dominant left vertebral artery patent without high-grade stenosis. 4. Atheromatous plaque about the left carotid bifurcation with estimated 45-50% stenosis by NASCET criteria. 5. Short-segment severe mid left A2 stenosis. Electronically Signed   By: Rise Mu M.D.   On: 05/17/2019 03:56   CT CHEST WO CONTRAST  Result Date: 05/17/2019 CLINICAL DATA:  Lung nodule.  Abnormal chest x-ray. EXAM: CT CHEST WITHOUT CONTRAST TECHNIQUE: Multidetector CT imaging of the chest was performed following the standard protocol without IV contrast. COMPARISON:  Two-view chest x-ray 05/07/2019 FINDINGS: Cardiovascular: Heart size is normal. Left mainstem coronary artery  calcification is present. Minimal calcifications are present at the aortic arch. No significant stenosis is present. No aneurysm is present. Pulmonary artery size is normal. Mediastinum/Nodes: No significant mediastinal, hilar, or axillary adenopathy present. Lungs/Pleura: Are clear without focal nodule, mass, or airspace disease. Upper Abdomen: Punctate nonobstructing stone is present in the right kidney. Water density 16 mm exophytic lesion in the left kidney likely represents a simple cyst. No other focal lesions are present. Musculoskeletal: Prominent syndesmophyte on the right at C5-6 corresponds to the density seen on the chest x-ray. Other focal osseous lesions are present. Vertebral body heights are maintained. IMPRESSION: 1. Prominent right-sided syndesmophyte corresponds to the density seen on the chest x-ray. No follow-up needed. 2. Calcification at the level of the left main coronary artery. Recommend cardiology consultation. 3. No pulmonary nodule. Electronically Signed   By: Marin Roberts M.D.   On: 05/17/2019 07:57   MR Brain Wo Contrast (neuro protocol)  Result Date: 05/16/2019 CLINICAL DATA:  Initial evaluation for acute dizziness. EXAM: MRI HEAD WITHOUT CONTRAST TECHNIQUE: Multiplanar, multiecho pulse sequences of the brain and surrounding structures were obtained without intravenous contrast. COMPARISON:  Prior head CT from 05/08/2019. FINDINGS: Brain: Generalized age-related cerebral atrophy. Advanced chronic small vessel ischemic disease with innumerable remote lacunar infarcts seen clustered about the bilateral basal ganglia, thalami, and pons. Few additional tiny remote bilateral cerebellar infarcts noted. Superimposed scattered multiple foci of susceptibility artifact consistent with chronic microhemorrhages, likely related to poorly controlled hypertension. 6 mm focus of restricted diffusion involving the right parietal cortex consistent with a small acute ischemic infarct (series  5, image 78). No associated hemorrhage  or mass effect. There is an additional punctate focus of diffusion abnormality involving the dorsal brainstem at the pontomedullary junction (series 5, image 59), which could reflect an additional small acute to subacute ischemic infarct. Difficult to be certain this finding given the adjacent chronic micro hemorrhages in susceptibility artifact. No associated acute hemorrhage or mass effect. No other evidence for acute or subacute ischemia. Gray-white matter differentiation otherwise maintained. No mass lesion, midline shift, or mass effect. No hydrocephalus or extra-axial fluid collection. Pituitary gland suprasellar region within normal limits. Midline structures intact. Vascular: Right vertebral artery hypoplastic with loss of normal flow void, likely occluded. Probable atherosclerotic change about the vertebrobasilar junction and proximal basilar artery. Major intracranial vascular flow voids otherwise maintained. Skull and upper cervical spine: Craniocervical junction within normal limits. Bone marrow signal intensity normal. No scalp soft tissue abnormality. Sinuses/Orbits: Globes and orbital soft tissues within normal limits. Extensive mucosal thickening in opacity seen throughout the paranasal sinuses, greatest within the frontoethmoidal sinuses. Few scattered superimposed air-fluid levels noted within the sphenoid sinuses. No mastoid effusion. Inner ear structures grossly normal. Other: None. IMPRESSION: 1. 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 2. Additional punctate focus of diffusion abnormality involving the dorsal brainstem at the pontomedullary junction, which could reflect an acute to early subacute ischemic infarct or possibly artifact. Difficult to be certain given adjacent susceptibility artifact within this region. 3. Severe chronic microvascular ischemic disease with innumerable remote lacunar infarcts cross  slurred about the bilateral basal ganglia, thalami, and pons, markedly advanced for age. 4. Multiple superimposed chronic micro hemorrhages clustered about the deep gray nuclei and brainstem, likely related to poorly controlled hypertension. 5. Acute on chronic pansinusitis, likely allergic/inflammatory in nature. Electronically Signed   By: Rise Mu M.D.   On: 05/16/2019 23:49   ECHOCARDIOGRAM COMPLETE  Result Date: 05/18/2019    ECHOCARDIOGRAM REPORT   Patient Name:   ABEDNEGO YEATES Date of Exam: 05/18/2019 Medical Rec #:  [id number]         Height:       69.0 in Accession #:    [id number]        Weight:       162.3 lb Date of Birth:  Jul 31, 1975         BSA:          1.890 m Patient Age:    44 years          BP:           177/115 mmHg Patient Gender: M                 HR:           84 bpm. Exam Location:  Inpatient Procedure: 2D Echo, Color Doppler and Cardiac Doppler Indications:    Stroke i163.9  History:        Patient has no prior history of Echocardiogram examinations.                 Risk Factors:Hypertension.  Sonographer:    Irving Burton Senior RDCS Referring Phys: [id number] ARSHAD N KAKRAKANDY IMPRESSIONS  1. Left ventricular ejection fraction, by estimation, is 60 to 65%. The left ventricle has normal function. The left ventricle has no regional wall motion abnormalities. There is moderate left ventricular hypertrophy. Left ventricular diastolic parameters are consistent with Grade I diastolic dysfunction (impaired relaxation).  2. Right ventricular systolic function is normal. The right ventricular size is normal.  3. The mitral valve is  normal in structure. Trivial mitral valve regurgitation. No evidence of mitral stenosis.  4. The aortic valve is tricuspid. Aortic valve regurgitation is not visualized. No aortic stenosis is present.  5. The inferior vena cava is normal in size with greater than 50% respiratory variability, suggesting right atrial pressure of 3 mmHg. FINDINGS  Left Ventricle: Left  ventricular ejection fraction, by estimation, is 60 to 65%. The left ventricle has normal function. The left ventricle has no regional wall motion abnormalities. The left ventricular internal cavity size was normal in size. There is  moderate left ventricular hypertrophy. Left ventricular diastolic parameters are consistent with Grade I diastolic dysfunction (impaired relaxation). Right Ventricle: The right ventricular size is normal. No increase in right ventricular wall thickness. Right ventricular systolic function is normal. Left Atrium: Left atrial size was normal in size. Right Atrium: Right atrial size was normal in size. Pericardium: There is no evidence of pericardial effusion. Mitral Valve: The mitral valve is normal in structure. There is mild thickening of the mitral valve leaflet(s). Normal mobility of the mitral valve leaflets. Trivial mitral valve regurgitation. No evidence of mitral valve stenosis. Tricuspid Valve: The tricuspid valve is normal in structure. Tricuspid valve regurgitation is trivial. No evidence of tricuspid stenosis. Aortic Valve: The aortic valve is tricuspid. Aortic valve regurgitation is not visualized. No aortic stenosis is present. Pulmonic Valve: The pulmonic valve was normal in structure. Pulmonic valve regurgitation is not visualized. No evidence of pulmonic stenosis. Aorta: The aortic root is normal in size and structure. Venous: The inferior vena cava is normal in size with greater than 50% respiratory variability, suggesting right atrial pressure of 3 mmHg. IAS/Shunts: No atrial level shunt detected by color flow Doppler.  LEFT VENTRICLE PLAX 2D LVIDd:         4.40 cm  Diastology LVIDs:         2.50 cm  LV e' lateral:   7.62 cm/s LV PW:         1.40 cm  LV E/e' lateral: 8.3 LV IVS:        1.60 cm  LV e' medial:    5.87 cm/s LVOT diam:     2.10 cm  LV E/e' medial:  10.7 LV SV:         91 LV SV Index:   48 LVOT Area:     3.46 cm  RIGHT VENTRICLE RV S prime:     15.90 cm/s  TAPSE (M-mode): 2.7 cm LEFT ATRIUM           Index       RIGHT ATRIUM           Index LA diam:      3.70 cm 1.96 cm/m  RA Area:     19.50 cm LA Vol (A2C): 53.0 ml 28.04 ml/m RA Volume:   50.80 ml  26.87 ml/m LA Vol (A4C): 33.1 ml 17.51 ml/m  AORTIC VALVE LVOT Vmax:   116.00 cm/s LVOT Vmean:  83.700 cm/s LVOT VTI:    0.262 m  AORTA Ao Root diam: 3.60 cm Ao Asc diam:  3.60 cm MITRAL VALVE MV Area (PHT): 2.48 cm    SHUNTS MV Decel Time: 306 msec    Systemic VTI:  0.26 m MV E velocity: 63.00 cm/s  Systemic Diam: 2.10 cm MV A velocity: 80.10 cm/s MV E/A ratio:  0.79 Charlton Haws MD Electronically signed by Charlton Haws MD Signature Date/Time: 05/18/2019/9:58:59 AM    Final  Scheduled Meds: . amLODipine  10 mg Oral Daily  . aspirin EC  325 mg Oral Daily  . [START ON 05/19/2019] atorvastatin  80 mg Oral Daily  . carvedilol  3.125 mg Oral BID WC  . clopidogrel  75 mg Oral Daily  . enoxaparin (LOVENOX) injection  40 mg Subcutaneous Q24H  . folic acid  1 mg Oral Daily  . lisinopril  10 mg Oral Daily  . multivitamin with minerals  1 tablet Oral Daily  . nicotine  14 mg Transdermal Daily  . thiamine  100 mg Oral Daily   Or  . thiamine  100 mg Intravenous Daily   Continuous Infusions:    LOS: 1 day   Time spent: 63min  Darrion Wyszynski C Kathleen Likins, DO [facility]  If 7PM-7AM, please contact night-coverage [email or web address]  05/18/2019, 3:14 PM

## 2019-05-18 NOTE — Progress Notes (Signed)
Called and txt paged MD M. Katherina Right that pt EKG done with results of ST elevation. Pt not c/o chest pain. VS done.

## 2019-05-18 NOTE — Progress Notes (Signed)
STROKE TEAM PROGRESS NOTE   INTERVAL HISTORY PT at bedside. Pt sitting at the edge of bed, awake alert, stating that his left leg still weak. He has quit smoking recently, stroke risk factor modification education provided.   Vitals:   05/18/19 0342 05/18/19 0538 05/18/19 0905 05/18/19 1222  BP: (!) 158/109 (!) 177/115 (!) 167/105 (!) 152/109  Pulse: 76 70 78 74  Resp: 19 20 18 18   Temp: 98.2 F (36.8 C) 98.2 F (36.8 C) 97.8 F (36.6 C) 97.9 F (36.6 C)  TempSrc: Oral Oral Oral Oral  SpO2: 98% 100% 100% 100%  Weight:      Height:        CBC:  Recent Labs  Lab 05/17/19 0617 05/18/19 0525  WBC 10.0 8.1  NEUTROABS 6.7  --   HGB 16.9 16.5  HCT 51.1 49.6  MCV 92.1 90.0  PLT 321 330    Basic Metabolic Panel:  Recent Labs  Lab 05/17/19 0617 05/18/19 0525  NA 137 139  K 3.6 3.4*  CL 104 103  CO2 24 25  GLUCOSE 100* 97  BUN 9 8  CREATININE 0.72 0.71  CALCIUM 9.1 9.2   Lipid Panel:     Component Value Date/Time   CHOL 105 05/17/2019 0101   CHOL 247 (H) 05/10/2019 1606   TRIG 26 05/17/2019 0101   HDL 37 (L) 05/17/2019 0101   HDL 69 05/10/2019 1606   CHOLHDL 2.8 05/17/2019 0101   VLDL 5 05/17/2019 0101   LDLCALC 63 05/17/2019 0101   LDLCALC 166 (H) 05/10/2019 1606   HgbA1c:  Lab Results  Component Value Date   HGBA1C 5.5 05/17/2019   Urine Drug Screen:     Component Value Date/Time   LABOPIA NONE DETECTED 05/16/2019 2004   COCAINSCRNUR NONE DETECTED 05/16/2019 2004   LABBENZ NONE DETECTED 05/16/2019 2004   AMPHETMU NONE DETECTED 05/16/2019 2004   THCU POSITIVE (A) 05/16/2019 2004   LABBARB NONE DETECTED 05/16/2019 2004    Alcohol Level No results found for: [facility]  IMAGING past 24 hours ECHOCARDIOGRAM COMPLETE  Result Date: 05/18/2019    ECHOCARDIOGRAM REPORT   Patient Name:   CALYN RUBI Date of Exam: 05/18/2019 Medical Rec #:  [id number]/[id number]         Height:       69.0 in Accession #:    [id number]        Weight:       162.3 lb Date of Birth:   Sep 03, 1975         BSA:          1.890 m Patient Age:    44 years          BP:           177/115 mmHg Patient Gender: M                 HR:           84 bpm. Exam Location:  Inpatient Procedure: 2D Echo, Color Doppler and Cardiac Doppler Indications:    Stroke i163.9  History:        Patient has no prior history of Echocardiogram examinations.                 Risk Factors:Hypertension.  Sonographer:    02/22/1975 Senior RDCS Referring Phys: [id number] ARSHAD N KAKRAKANDY IMPRESSIONS  1. Left ventricular ejection fraction, by estimation, is 60 to 65%. The left ventricle has normal function. The left ventricle has no regional wall  motion abnormalities. There is moderate left ventricular hypertrophy. Left ventricular diastolic parameters are consistent with Grade I diastolic dysfunction (impaired relaxation).  2. Right ventricular systolic function is normal. The right ventricular size is normal.  3. The mitral valve is normal in structure. Trivial mitral valve regurgitation. No evidence of mitral stenosis.  4. The aortic valve is tricuspid. Aortic valve regurgitation is not visualized. No aortic stenosis is present.  5. The inferior vena cava is normal in size with greater than 50% respiratory variability, suggesting right atrial pressure of 3 mmHg. FINDINGS  Left Ventricle: Left ventricular ejection fraction, by estimation, is 60 to 65%. The left ventricle has normal function. The left ventricle has no regional wall motion abnormalities. The left ventricular internal cavity size was normal in size. There is  moderate left ventricular hypertrophy. Left ventricular diastolic parameters are consistent with Grade I diastolic dysfunction (impaired relaxation). Right Ventricle: The right ventricular size is normal. No increase in right ventricular wall thickness. Right ventricular systolic function is normal. Left Atrium: Left atrial size was normal in size. Right Atrium: Right atrial size was normal in size. Pericardium: There is no  evidence of pericardial effusion. Mitral Valve: The mitral valve is normal in structure. There is mild thickening of the mitral valve leaflet(s). Normal mobility of the mitral valve leaflets. Trivial mitral valve regurgitation. No evidence of mitral valve stenosis. Tricuspid Valve: The tricuspid valve is normal in structure. Tricuspid valve regurgitation is trivial. No evidence of tricuspid stenosis. Aortic Valve: The aortic valve is tricuspid. Aortic valve regurgitation is not visualized. No aortic stenosis is present. Pulmonic Valve: The pulmonic valve was normal in structure. Pulmonic valve regurgitation is not visualized. No evidence of pulmonic stenosis. Aorta: The aortic root is normal in size and structure. Venous: The inferior vena cava is normal in size with greater than 50% respiratory variability, suggesting right atrial pressure of 3 mmHg. IAS/Shunts: No atrial level shunt detected by color flow Doppler.  LEFT VENTRICLE PLAX 2D LVIDd:         4.40 cm  Diastology LVIDs:         2.50 cm  LV e' lateral:   7.62 cm/s LV PW:         1.40 cm  LV E/e' lateral: 8.3 LV IVS:        1.60 cm  LV e' medial:    5.87 cm/s LVOT diam:     2.10 cm  LV E/e' medial:  10.7 LV SV:         91 LV SV Index:   48 LVOT Area:     3.46 cm  RIGHT VENTRICLE RV S prime:     15.90 cm/s TAPSE (M-mode): 2.7 cm LEFT ATRIUM           Index       RIGHT ATRIUM           Index LA diam:      3.70 cm 1.96 cm/m  RA Area:     19.50 cm LA Vol (A2C): 53.0 ml 28.04 ml/m RA Volume:   50.80 ml  26.87 ml/m LA Vol (A4C): 33.1 ml 17.51 ml/m  AORTIC VALVE LVOT Vmax:   116.00 cm/s LVOT Vmean:  83.700 cm/s LVOT VTI:    0.262 m  AORTA Ao Root diam: 3.60 cm Ao Asc diam:  3.60 cm MITRAL VALVE MV Area (PHT): 2.48 cm    SHUNTS MV Decel Time: 306 msec    Systemic VTI:  0.26 m MV E velocity:  63.00 cm/s  Systemic Diam: 2.10 cm MV A velocity: 80.10 cm/s MV E/A ratio:  0.79 Charlton Haws MD Electronically signed by Charlton Haws MD Signature Date/Time:  05/18/2019/9:58:59 AM    Final     PHYSICAL EXAM  Temp:  [97.5 F (36.4 C)-99.4 F (37.4 C)] 97.9 F (36.6 C) (04/23 1222) Pulse Rate:  [70-96] 74 (04/23 1222) Resp:  [18-20] 18 (04/23 1222) BP: (152-186)/(103-119) 152/109 (04/23 1222) SpO2:  [89 %-100 %] 100 % (04/23 1222)  General - Well nourished, well developed, in no apparent distress.  Ophthalmologic - fundi not visualized due to noncooperation.  Cardiovascular - Regular rhythm and rate.  Mental Status -  Level of arousal and orientation to time, place, and person were intact. Language including expression, naming, repetition, comprehension was assessed and found intact. Fund of Knowledge was assessed and was intact.  Cranial Nerves II - XII - II - Visual field intact OU. III, IV, VI - Extraocular movements intact. V - Facial sensation intact bilaterally. VII - Facial movement intact bilaterally. VIII - Hearing & vestibular intact bilaterally. X - Palate elevates symmetrically. XI - Chin turning & shoulder shrug intact bilaterally. XII - Tongue protrusion intact.  Motor Strength - The patient's strength was normal in all extremities and pronator drift was absent except left LE proximal 4+/5.  Bulk was normal and fasciculations were absent.   Motor Tone - Muscle tone was assessed at the neck and appendages and was normal.  Reflexes - The patient's reflexes were symmetrical in all extremities and he had no pathological reflexes.  Sensory - Light touch, temperature/pinprick were assessed and were symmetrical.    Coordination - The patient had normal movements in the hands with no ataxia or dysmetria.  Tremor was absent.  Gait and Station - deferred.   ASSESSMENT/PLAN Mr. Ayush Brotzman is a 44 y.o. male with history of HTN who recently stopped smoking presenting with nausea/vomiting and dizziness. He had 2 wk hx R arm and leg numbness for which he was seen in ED w/ neg CT and MRI recommended but pt did not do or  f/u.   Stroke:  Acute/subacute R parietal cortical punctate infarct, pontine and left cerebellar peduncle infarcts as well as previous multiple lacunar old infarcts, likely secondary to small vessel disease source       MRI 4/21 R parietal cortical infarct. Dorsal brainstem and cerebellar peduncle infarcts. Severe chronic small vessel disease w/ innumerable old lacunes (B basal ganglia, thalami, pons). Multiple chronic microhemorrhages.   CT head 4/22  Advanced small vessel disease. No acute abnormality.   CTA head & neck 4/22 no LVO.  Hypoplastic distal BA with bilateral fetal PCAs. R hypoplastic VA distal stenosis vs occlusion. L ICA bulb 45-50% stenosis. L ICA siphone and A2 short severe stenosis.   2D Echo EF 60-56%. No source of embolus   LDL 63, down from 166 one week ago  HgbA1c 5.5  Lovenox 40 mg sq daily for VTE prophylaxis  aspirin 81 mg daily prior to admission, now on aspirin 325 mg daily and clopidogrel 75 mg daily. Continue DAPT x 3 months then aspirin alone given multifocal intracranial stenosis.  Therapy recommendations:  CIR  Disposition:  pending   History of recent stroke  4/12-13 right arm leg numbness and right hand weakness.  CT head 4/12 No acute abnormality. Small vessel disease greater than expected. Multiple old small vessel infarcts.  Patient left AMA from ED  Follow-up with PCP started on BP meds 4/15.  LDL 166, also started on statin 4/15  Hypertensive Emergency  BP as high as 188/116  Remains elevated . Gradually normalize in 3-5 days . Long-term BP goal 130-150 given severe intracranial stenosis  Hyperlipidemia  Home meds:  lipitor 20  Now on lipitor 80   LDL 63, goal < 70  Continue statin at discharge  Other Stroke Risk Factors  Cigarette smoker, recently stopped. On chantix.   Hx ETOH use, none x 4 wks, advised to drink no more than 2 drink(s) a day  Substance abuse - UDS:  THC POSITIVE, Patient advised to stop using due  to stroke risk.  CAD, seen on CT chest  Multiple small lacunar infarcts on MRI  Other [facility] day # 1  Neurology will sign off. Please call with questions. Pt will follow up with stroke clinic NP at [facility] in about 4 weeks. Thanks for the consult.  Rosalin Hawking, MD PhD Stroke Neurology 05/18/2019 4:54 PM    To contact Stroke Continuity provider, please refer to [email or web address] After hours, contact General Neurology

## 2019-05-18 NOTE — Progress Notes (Signed)
Inpatient Rehab Admissions Coordinator Note:   Per PT/OT recommendations, pt was screened for CIR candidacy by Estill Dooms, PT, DPT.  At this time we are recommending CIR consult.  I will place an order per our protocol.  Please contact me with questions.   Estill Dooms, PT, DPT [phone] 05/18/19 9:36 AM

## 2019-05-18 NOTE — Progress Notes (Signed)
Physical Therapy Treatment Patient Details Name: Victor Davies MRN: [id number] DOB: Apr 26, 1975 Today's Date: 05/18/2019    History of Present Illness 44 y.o. male with history of tobacco abuse, hypertension had a history of alcohol abuse patient states he has not had any alcohol for last 3 to 4 weeks has been experiencing increasing dizziness over the last 4 weeks.  Also has some numbness and tightness of the right upper and lower extremities over the same period of time last 4 weeks.    PT Comments    Patient progressing with gait and balance though still fall risk with community mobility with DGI 16/24.  He was able to participate in higher level balance activities including tandem stand, step taps without UE support and backwards walking.  Feel he will continue to benefit from skilled PT in the acute setting, but seems too high level for CIR level rehab.  Recommend outpatient PT follow up.  Educated in stroke diagnosis, likely risk factors and began discussion on modification with MD entering room to continue discussion.   Follow Up Recommendations  Outpatient PT;Supervision - Intermittent     Equipment Recommendations  None recommended by PT    Recommendations for Other Services       Precautions / Restrictions Precautions Precautions: Fall Restrictions Weight Bearing Restrictions: No    Mobility  Bed Mobility Overal bed mobility: Independent                Transfers Overall transfer level: Needs assistance Equipment used: None   Sit to Stand: Supervision         General transfer comment: for safety  Ambulation/Gait Ambulation/Gait assistance: Min guard;Supervision Gait Distance (Feet): 200 Feet Assistive device: None Gait Pattern/deviations: Step-through pattern;Drifts right/left     General Gait Details: close to obstacles on L side and occsional cues to avoid,   Stairs Stairs: Yes Stairs assistance: Modified independent (Device/Increase  time) Stair Management: One rail Right;Forwards;Alternating pattern Number of Stairs: 6 General stair comments: initially using both rails, able to safely use one rail with cues   Wheelchair Mobility    Modified Rankin (Stroke Patients Only) Modified Rankin (Stroke Patients Only) Pre-Morbid Rankin Score: No significant disability Modified Rankin: Moderate disability     Balance     Sitting balance-Leahy Scale: Normal       Standing balance-Leahy Scale: Good Standing balance comment: higher level balance work inculding step taps to 6" step no UE support, tandem standing with S x 30 sec, marching, backward walking, sit<>stand no UE support, side stepping                 Standardized Balance Assessment Standardized Balance Assessment : Dynamic Gait Index   Dynamic Gait Index Level Surface: Mild Impairment Change in Gait Speed: Mild Impairment Gait with Horizontal Head Turns: Mild Impairment Gait with Vertical Head Turns: Mild Impairment Gait and Pivot Turn: Moderate Impairment Step Over Obstacle: Mild Impairment Step Around Obstacles: Normal Steps: Mild Impairment Total Score: 16      Cognition Arousal/Alertness: Awake/alert Behavior During Therapy: WFL for tasks assessed/performed Overall Cognitive Status: Impaired/Different from baseline                     Current Attention Level: Sustained Memory: Decreased short-term memory Following Commands: Follows multi-step commands with increased time;Follows one step commands consistently Safety/Judgement: Decreased awareness of safety;Decreased awareness of deficits   Problem Solving: Slow processing;Requires verbal cues General Comments: able to recall room number with time, aware he has deficits  on his R side, needed reminders that his symptoms were due to stroke      Exercises      General Comments        Pertinent Vitals/Pain Faces Pain Scale: No hurt    Home Living   Living Arrangements:  Spouse/significant other;Children Available Help at Discharge: Family;Available PRN/intermittently Type of Home: Apartment     Home Layout: Two level        Prior Function            PT Goals (current goals can now be found in the care plan section) Progress towards PT goals: Progressing toward goals    Frequency    Min 4X/week      PT Plan Discharge plan needs to be updated    Co-evaluation              AM-PAC PT "6 Clicks" Mobility   Outcome Measure  Help needed turning from your back to your side while in a flat bed without using bedrails?: None Help needed moving from lying on your back to sitting on the side of a flat bed without using bedrails?: None Help needed moving to and from a bed to a chair (including a wheelchair)?: None Help needed standing up from a chair using your arms (e.g., wheelchair or bedside chair)?: None Help needed to walk in hospital room?: A Little Help needed climbing 3-5 steps with a railing? : None 6 Click Score: 23    End of Session   Activity Tolerance: Patient tolerated treatment well Patient left: in bed;with call bell/phone within reach;with bed alarm set   PT Visit Diagnosis: Other symptoms and signs involving the nervous system (R29.898);Other abnormalities of gait and mobility (R26.89)     Time: 1430-1453 PT Time Calculation (min) (ACUTE ONLY): 23 min  Charges:  $Gait Training: 8-22 mins $Self Care/Home Management: 8-22                     Sheran Lawless, PT Acute Rehabilitation Services [phone] 05/18/2019    Elray Mcgregor 05/18/2019, 5:02 PM

## 2019-05-18 NOTE — Progress Notes (Signed)
Progress Note  Patient Name: Victor Davies Date of Encounter: 05/18/2019  Primary Cardiologist: New  Subjective   Patient denies CP ever  Breathing is OK  He says at home his breathing is OK     Inpatient Medications    Scheduled Meds: . amLODipine  10 mg Oral Daily  . aspirin  300 mg Rectal Daily   Or  . aspirin  325 mg Oral Daily  . atorvastatin  20 mg Oral Daily  . carvedilol  3.125 mg Oral BID WC  . enoxaparin (LOVENOX) injection  40 mg Subcutaneous Q24H  . folic acid  1 mg Oral Daily  . lisinopril  10 mg Oral Daily  . multivitamin with minerals  1 tablet Oral Daily  . nicotine  14 mg Transdermal Daily  . thiamine  100 mg Oral Daily   Or  . thiamine  100 mg Intravenous Daily   Continuous Infusions:  PRN Meds: acetaminophen **OR** acetaminophen (TYLENOL) oral liquid 160 mg/5 mL **OR** acetaminophen, hydrALAZINE, labetalol   Vital Signs    Vitals:   05/18/19 0023 05/18/19 0142 05/18/19 0342 05/18/19 0538  BP: (!) 179/119 (!) 169/110 (!) 158/109 (!) 177/115  Pulse: 70 76 76 70  Resp:   19 20  Temp:   98.2 F (36.8 C) 98.2 F (36.8 C)  TempSrc:   Oral Oral  SpO2:  100% 98% 100%  Weight:      Height:        Intake/Output Summary (Last 24 hours) at 05/18/2019 0635 Last data filed at 05/17/2019 1646 Gross per 24 hour  Intake 120 ml  Output --  Net 120 ml   Last 3 Weights 05/17/2019 05/10/2019 05/07/2019  Weight (lbs) 162 lb 4.1 oz 162 lb 6.4 oz 153 lb  Weight (kg) 73.6 kg 73.664 kg 69.4 kg      Telemetry     SR  - Personally Reviewed  ECG     SR  LVH  ST changes consistent with early repolarization - Personally Reviewed  Physical Exam   GEN: No acute distress.   Neck: No JVD Cardiac: RRR, no murmurs, rubs, or gallops.  Respiratory: Clear to auscultation bilaterally. GI: Soft, nontender, non-distended  MS: No edema; No deformity. Neuro:  Nonfocal  Psych: Normal affect   Labs    High Sensitivity Troponin:   Recent Labs  Lab  05/07/19 2211 05/08/19 0126  TROPONINIHS 17 19*      Chemistry Recent Labs  Lab 05/16/19 2004 05/16/19 2004 05/17/19 0101 05/17/19 0617 05/18/19 0525  NA 140  --   --  137 139  K 4.2  --   --  3.6 3.4*  CL 103  --   --  104 103  CO2 26  --   --  24 25  GLUCOSE 116*  --   --  100* 97  BUN 13  --   --  9 8  CREATININE 0.85   < > 0.52* 0.72 0.71  CALCIUM 9.3  --   --  9.1 9.2  PROT 8.0  --   --  7.1  --   ALBUMIN 4.3  --   --  3.8  --   AST 19  --   --  15  --   ALT 22  --   --  17  --   ALKPHOS 67  --   --  60  --   BILITOT 1.0  --   --  1.4*  --  GFRNONAA >60   < > >60 >60 >60  GFRAA >60   < > >60 >60 >60  ANIONGAP 11  --   --  9 11   < > = values in this interval not displayed.     Hematology Recent Labs  Lab 05/17/19 0101 05/17/19 0617 05/18/19 0525  WBC 10.3 10.0 8.1  RBC 5.41 5.55 5.51  HGB 16.6 16.9 16.5  HCT 49.7 51.1 49.6  MCV 91.9 92.1 90.0  MCH 30.7 30.5 29.9  MCHC 33.4 33.1 33.3  RDW 13.0 12.9 12.8  PLT 339 321 330    BNPNo results for input(s): BNP, PROBNP in the last 168 hours.   DDimer No results for input(s): DDIMER in the last 168 hours.   Radiology    CT ANGIO HEAD W OR WO CONTRAST  Result Date: 05/17/2019 CLINICAL DATA:  Follow-up examination for acute stroke. EXAM: CT ANGIOGRAPHY HEAD AND NECK TECHNIQUE: Multidetector CT imaging of the head and neck was performed using the standard protocol during bolus administration of intravenous contrast. Multiplanar CT image reconstructions and MIPs were obtained to evaluate the vascular anatomy. Carotid stenosis measurements (when applicable) are obtained utilizing NASCET criteria, using the distal internal carotid diameter as the denominator. CONTRAST:  OMNIPAQUE IOHEXOL 350 MG/ML SOLN COMPARISON:  Prior brain MRI from 05/16/2019. FINDINGS: CT HEAD FINDINGS Brain: Mildly advanced cerebral atrophy for age. Advanced chronic microvascular ischemic disease with multiple remote lacunar infarcts  clustered about the deep gray nuclei and pons, better seen on prior brain MRI. Previously identified small strokes not visible by CT. No intracranial hemorrhage. No mass lesion, midline shift or mass effect. No hydrocephalus or extra-axial fluid collection. Vascular: No appreciable hyperdense vessel, better evaluated on concomitant CTA. Scattered atherosclerotic change at the skull base. Skull: Scalp soft tissues and calvarium within normal limits. Sinuses: Extensive acute on chronic pan sinusitis as previously described again noted. Mastoid air cells remain clear. Orbits: Globes and orbital soft tissues demonstrate no acute finding. Review of the MIP images confirms the above findings CTA NECK FINDINGS Aortic arch: Visualized arch of normal caliber with normal branch pattern. No hemodynamically significant stenosis seen about the origin of the great vessels. Visualized subclavian arteries widely patent. Right carotid system: Right common carotid artery patent from its origin to the bifurcation without stenosis. Mild eccentric calcified plaque at the proximal right ICA without stenosis. Right ICA widely patent distally without stenosis, dissection or occlusion. Left carotid system: Left CCA patent from its origin to the bifurcation without stenosis. Scattered calcified plaque about the left bifurcation/proximal left ICA with associated stenosis of up to approximately 45-50% by NASCET criteria. Left ICA otherwise patent distally to the skull base without stenosis, dissection, or occlusion. Vertebral arteries: Both vertebral arteries arise from the subclavian arteries. Left vertebral artery dominant, with a diffusely hypoplastic right vertebral artery. Vertebral body res widely patent within the neck without stenosis or other abnormality. Skeleton: No acute osseous finding. No discrete or worrisome osseous lesions. Poor dentition with innumerable dental caries and periapical lucencies noted. Other neck: No other acute  soft tissue abnormality within the neck. No mass lesion or adenopathy. Upper chest: Visualized upper chest demonstrates no acute finding. Review of the MIP images confirms the above findings CTA HEAD FINDINGS Anterior circulation: Petrous segments patent bilaterally. Scattered atheromatous irregularity within the cavernous/supraclinoid ICAs bilaterally, left slightly greater than right. Short-segment moderate stenosis seen involving the anterior genu of the cavernous left ICA (series 11, image 225). No significant narrowing about the  right carotid siphon. ICA termini well perfused. A1 segments patent bilaterally. Normal anterior communicating artery complex. Right A2 segment widely patent to its distal aspect. Short-segment severe stenosis seen involving the mid left A2 segment (series 14, image 49). No M1 stenosis or occlusion. Negative MCA bifurcations. Distal MCA branches well perfused and symmetric. Posterior circulation: Dominant left vertebral artery irregular but patent to the vertebrobasilar junction without stenosis. Hypoplastic right vertebral artery largely terminates in PICA, and is either occluded or severely stenotic distally. Vertebrobasilar junction and proximal basilar artery are irregular with a dolichoectatic appearance. The mid and distal basilar artery are diminutive but patent. Superior cerebral arteries grossly patent bilaterally. Predominant fetal type origin of both PCAs. PCAs grossly patent to their distal aspects without stenosis. Venous sinuses: Grossly patent allowing for timing the contrast bolus. Anatomic variants: Predominant fetal type origin of both PCAs. Review of the MIP images confirms the above findings IMPRESSION: CT HEAD IMPRESSION: 1. Advanced chronic microvascular ischemic disease for age with multiple remote lacunar infarcts involving the deep gray nuclei and pons, better characterized on recent brain MRI. Recently identified subcentimeter infarcts not visible by CT. 2. No  other new acute intracranial abnormality. 3. Acute on chronic pansinusitis. CTA HEAD AND NECK IMPRESSION: 1. Negative CTA for large vessel occlusion. 2. 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 The mid and distal basilar artery are diminutive but patent, with predominant fetal type origin of both PCAs. 3. 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 Dominant left vertebral artery patent without high-grade stenosis. 4. Atheromatous plaque about the left carotid bifurcation with estimated 45-50% stenosis by NASCET criteria. 5. Short-segment severe mid left A2 stenosis. Electronically Signed   By: Rise MuBenjamin  McClintock M.D.   On: 05/17/2019 03:56   CT ANGIO NECK W OR WO CONTRAST  Result Date: 05/17/2019 CLINICAL DATA:  Follow-up examination for acute stroke. EXAM: CT ANGIOGRAPHY HEAD AND NECK TECHNIQUE: Multidetector CT imaging of the head and neck was performed using the standard protocol during bolus administration of intravenous contrast. Multiplanar CT image reconstructions and MIPs were obtained to evaluate the vascular anatomy. Carotid stenosis measurements (when applicable) are obtained utilizing NASCET criteria, using the distal internal carotid diameter as the denominator. CONTRAST:  100mL OMNIPAQUE IOHEXOL 350 MG/ML SOLN COMPARISON:  Prior brain MRI from 05/16/2019. FINDINGS: CT HEAD FINDINGS Brain: Mildly advanced cerebral atrophy for age. Advanced chronic microvascular ischemic disease with multiple remote lacunar infarcts clustered about the deep gray nuclei and pons, better seen on prior brain MRI. Previously identified small strokes not visible by CT. No intracranial hemorrhage. No mass lesion, midline shift or mass effect. No hydrocephalus or extra-axial fluid collection. Vascular: No appreciable hyperdense vessel, better evaluated on concomitant  CTA. Scattered atherosclerotic change at the skull base. Skull: Scalp soft tissues and calvarium within normal limits. Sinuses: Extensive acute on chronic pan sinusitis as previously described again noted. Mastoid air cells remain clear. Orbits: Globes and orbital soft tissues demonstrate no acute finding. Review of the MIP images confirms the above findings CTA NECK FINDINGS Aortic arch: Visualized arch of normal caliber with normal branch pattern. No hemodynamically significant stenosis seen about the origin of the great vessels. Visualized subclavian arteries widely patent. Right carotid system: Right common carotid artery patent from its origin to the bifurcation without stenosis. Mild eccentric calcified plaque at the proximal right ICA without stenosis. Right ICA widely patent distally without stenosis, dissection or occlusion.  Left carotid system: Left CCA patent from its origin to the bifurcation without stenosis. Scattered calcified plaque about the left bifurcation/proximal left ICA with associated stenosis of up to approximately 45-50% by NASCET criteria. Left ICA otherwise patent distally to the skull base without stenosis, dissection, or occlusion. Vertebral arteries: Both vertebral arteries arise from the subclavian arteries. Left vertebral artery dominant, with a diffusely hypoplastic right vertebral artery. Vertebral body res widely patent within the neck without stenosis or other abnormality. Skeleton: No acute osseous finding. No discrete or worrisome osseous lesions. Poor dentition with innumerable dental caries and periapical lucencies noted. Other neck: No other acute soft tissue abnormality within the neck. No mass lesion or adenopathy. Upper chest: Visualized upper chest demonstrates no acute finding. Review of the MIP images confirms the above findings CTA HEAD FINDINGS Anterior circulation: Petrous segments patent bilaterally. Scattered atheromatous irregularity within the  cavernous/supraclinoid ICAs bilaterally, left slightly greater than right. Short-segment moderate stenosis seen involving the anterior genu of the cavernous left ICA (series 11, image 225). No significant narrowing about the right carotid siphon. ICA termini well perfused. A1 segments patent bilaterally. Normal anterior communicating artery complex. Right A2 segment widely patent to its distal aspect. Short-segment severe stenosis seen involving the mid left A2 segment (series 14, image 49). No M1 stenosis or occlusion. Negative MCA bifurcations. Distal MCA branches well perfused and symmetric. Posterior circulation: Dominant left vertebral artery irregular but patent to the vertebrobasilar junction without stenosis. Hypoplastic right vertebral artery largely terminates in PICA, and is either occluded or severely stenotic distally. Vertebrobasilar junction and proximal basilar artery are irregular with a dolichoectatic appearance. The mid and distal basilar artery are diminutive but patent. Superior cerebral arteries grossly patent bilaterally. Predominant fetal type origin of both PCAs. PCAs grossly patent to their distal aspects without stenosis. Venous sinuses: Grossly patent allowing for timing the contrast bolus. Anatomic variants: Predominant fetal type origin of both PCAs. Review of the MIP images confirms the above findings IMPRESSION: CT HEAD IMPRESSION: 1. Advanced chronic microvascular ischemic disease for age with multiple remote lacunar infarcts involving the deep gray nuclei and pons, better characterized on recent brain MRI. Recently identified subcentimeter infarcts not visible by CT. 2. No other new acute intracranial abnormality. 3. Acute on chronic pansinusitis. CTA HEAD AND NECK IMPRESSION: 1. Negative CTA for large vessel occlusion. 2. 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 The mid and distal basilar artery are diminutive but patent, with  predominant fetal type origin of both PCAs. 3. 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 Dominant left vertebral artery patent without high-grade stenosis. 4. Atheromatous plaque about the left carotid bifurcation with estimated 45-50% stenosis by NASCET criteria. 5. Short-segment severe mid left A2 stenosis. Electronically Signed   By: Rise Mu M.D.   On: 05/17/2019 03:56   CT CHEST WO CONTRAST  Result Date: 05/17/2019 CLINICAL DATA:  Lung nodule.  Abnormal chest x-ray. EXAM: CT CHEST WITHOUT CONTRAST TECHNIQUE: Multidetector CT imaging of the chest was performed following the standard protocol without IV contrast. COMPARISON:  Two-view chest x-ray 05/07/2019 FINDINGS: Cardiovascular: Heart size is normal. Left mainstem coronary artery calcification is present. Minimal calcifications are present at the aortic arch. No significant stenosis is present. No aneurysm is present. Pulmonary artery size is normal. Mediastinum/Nodes: No significant mediastinal, hilar, or axillary adenopathy present. Lungs/Pleura: Are clear without focal nodule, mass, or airspace disease. Upper Abdomen: Punctate nonobstructing  stone is present in the right kidney. Water density 16 mm exophytic lesion in the left kidney likely represents a simple cyst. No other focal lesions are present. Musculoskeletal: Prominent syndesmophyte on the right at C5-6 corresponds to the density seen on the chest x-ray. Other focal osseous lesions are present. Vertebral body heights are maintained. IMPRESSION: 1. Prominent right-sided syndesmophyte corresponds to the density seen on the chest x-ray. No follow-up needed. 2. Calcification at the level of the left main coronary artery. Recommend cardiology consultation. 3. No pulmonary nodule. Electronically Signed   By: San Morelle M.D.   On: 05/17/2019 07:57   MR Brain Wo Contrast  (neuro protocol)  Result Date: 05/16/2019 CLINICAL DATA:  Initial evaluation for acute dizziness. EXAM: MRI HEAD WITHOUT CONTRAST TECHNIQUE: Multiplanar, multiecho pulse sequences of the brain and surrounding structures were obtained without intravenous contrast. COMPARISON:  Prior head CT from 05/08/2019. FINDINGS: Brain: Generalized age-related cerebral atrophy. Advanced chronic small vessel ischemic disease with innumerable remote lacunar infarcts seen clustered about the bilateral basal ganglia, thalami, and pons. Few additional tiny remote bilateral cerebellar infarcts noted. Superimposed scattered multiple foci of susceptibility artifact consistent with chronic microhemorrhages, likely related to poorly controlled hypertension. 6 mm focus of restricted diffusion involving the right parietal cortex consistent with a small acute ischemic infarct (series 5, image 78). No associated hemorrhage or mass effect. There is an additional punctate focus of diffusion abnormality involving the dorsal brainstem at the pontomedullary junction (series 5, image 59), which could reflect an additional small acute to subacute ischemic infarct. Difficult to be certain this finding given the adjacent chronic micro hemorrhages in susceptibility artifact. No associated acute hemorrhage or mass effect. No other evidence for acute or subacute ischemia. Gray-white matter differentiation otherwise maintained. No mass lesion, midline shift, or mass effect. No hydrocephalus or extra-axial fluid collection. Pituitary gland suprasellar region within normal limits. Midline structures intact. Vascular: Right vertebral artery hypoplastic with loss of normal flow void, likely occluded. Probable atherosclerotic change about the vertebrobasilar junction and proximal basilar artery. Major intracranial vascular flow voids otherwise maintained. Skull and upper cervical spine: Craniocervical junction within normal limits. Bone marrow signal  intensity normal. No scalp soft tissue abnormality. Sinuses/Orbits: Globes and orbital soft tissues within normal limits. Extensive mucosal thickening in opacity seen throughout the paranasal sinuses, greatest within the frontoethmoidal sinuses. Few scattered superimposed air-fluid levels noted within the sphenoid sinuses. No mastoid effusion. Inner ear structures grossly normal. Other: None. IMPRESSION: 1. 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 2. Additional punctate focus of diffusion abnormality involving the dorsal brainstem at the pontomedullary junction, which could reflect an acute to early subacute ischemic infarct or possibly artifact. Difficult to be certain given adjacent susceptibility artifact within this region. 3. 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 4. Multiple superimposed chronic micro hemorrhages clustered about the deep gray nuclei and brainstem, likely related to poorly controlled hypertension. 5. Acute on chronic pansinusitis, likely allergic/inflammatory in nature. Electronically Signed   By: Jeannine Boga M.D.   On: 05/16/2019 23:49    Cardiac Studies   Echo pending   Patient Profile        Victor Davies is a 44 y.o. male with a PMH of HTN, HLD, tobacco abuse though recently quit smoking, and ETOH abuse, though has not had any alcohol in 3-4 weeks, who is being seen today for the  evaluation of HTN at the request of Dr. Blake Divine.     Assessment & Plan    1  HTN   Pt remains severely HTN   Meds being readded slowly WIll review echo    2 CAD   PT with severe calcification of LM orifice  Never had angina   WIll review echo   Will need imaging of LM area.  CT coronary angiogram to start when stabilized    3  LIpids   LDL 63   HDL 37   On lipitor now   80   For questions or  updates, please contact [facility] Please consult [email or web address] for contact info under        Signed, Dietrich Pates, MD  05/18/2019, 6:35 AM

## 2019-05-18 NOTE — TOC Initial Note (Signed)
Transition of Care [facility]) - Initial/Assessment Note    Patient Details  Name: Victor Davies MRN: [id number] Date of Birth: 1975-11-15  Transition of [facility]) CM/SW Contact:    Kermit Balo, RN Phone Number: 05/18/2019, 1:17 PM  Clinical Narrative:                 Pt is without insurance but has been seen at [facility]. CM asked about affording him medications and he stated pretty well. CM encouraged him to use [facility] pharmacy for assistance with the cost.  Pt states wife can provide any needed transportation. No DME at home.  Recommendations are for CIR. CM following.   Expected Discharge Plan: IP Rehab Facility Barriers to Discharge: Inadequate or no insurance   Patient Goals and CMS Choice     Choice offered to / list presented to : Patient, Spouse  Expected Discharge Plan and Services Expected Discharge Plan: IP Rehab Facility   Discharge Planning Services: CM Consult Post Acute Care Choice: IP Rehab                                        Prior Living Arrangements/Services   Lives with:: Spouse Patient language and need for interpreter reviewed:: Yes Do you feel safe going back to the place where you live?: Yes      Need for Family Participation in Patient Care: Yes (Comment) Care giver support system in place?: Yes (comment)(states wife works)   Estate agent Involvement Pertinent to Current Situation/Hospitalization: No - Comment as needed  Activities of Daily Living Home Assistive Devices/Equipment: None ADL Screening (condition at time of admission) Patient's cognitive ability adequate to safely complete daily activities?: Yes Is the patient deaf or have difficulty hearing?: No Does the patient have difficulty seeing, even when wearing glasses/contacts?: No Does the patient have difficulty concentrating, remembering, or making decisions?: No Patient able to express need for assistance with ADLs?: Yes Does the patient have difficulty  dressing or bathing?: No Independently performs ADLs?: Yes (appropriate for developmental age) Does the patient have difficulty walking or climbing stairs?: No Weakness of Legs: None Weakness of Arms/Hands: None  Permission Sought/Granted                  Emotional Assessment Appearance:: Appears stated age Attitude/Demeanor/Rapport: Engaged Affect (typically observed): Accepting Orientation: : Oriented to Self, Oriented to Place, Oriented to Situation Alcohol / Substance Use: Illicit Drugs(THC) Psych Involvement: No (comment)  Admission diagnosis:  Dizziness [R42] Acute ischemic stroke [facility]) [I63.9] Acute CVA (cerebrovascular accident) [facility]) [I63.9] Patient Active Problem List   Diagnosis Date Noted  . Acute CVA (cerebrovascular accident) (HCC) 05/17/2019  . Essential hypertension 05/10/2019  . Tobacco dependence 05/10/2019  . 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 Opacity of lung on imaging study 05/10/2019  . Polycythemia 05/10/2019   PCP:  Marcine Matar, MD Pharmacy:   [facility] [address], [place] - [address] [place] [address] Phone: [phone] Fax: [phone]     Social Determinants of Health (SDOH) Interventions    Readmission Risk Interventions No flowsheet data found.

## 2019-05-18 NOTE — Progress Notes (Signed)
Notified [facility] to just give them a heads up. MD M. Danny called and another EKG was done and it states NSR. PRN meds have been clarified and given.

## 2019-05-18 NOTE — Consult Note (Addendum)
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 tissue abnormality. Sinuses/Orbits: Globes and orbital soft tissues within normal limits. Extensive mucosal thickening in opacity seen throughout the paranasal sinuses, greatest within the frontoethmoidal sinuses. Few scattered superimposed air-fluid levels noted within the sphenoid sinuses. No mastoid effusion. Inner ear structures grossly normal. Other: None. IMPRESSION: 1. 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 2. Additional punctate focus of diffusion abnormality involving the dorsal brainstem at the pontomedullary junction, which could reflect an acute to early subacute ischemic infarct or possibly artifact. Difficult to be certain given adjacent susceptibility artifact within this region. 3. 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 4. Multiple superimposed chronic micro hemorrhages clustered about the deep gray nuclei and brainstem, likely related to poorly  controlled hypertension. 5. Acute on chronic pansinusitis, likely allergic/inflammatory in nature. Electronically Signed   By: Rise MuBenjamin  McClintock M.D.   On: 05/16/2019 23:49   ECHOCARDIOGRAM COMPLETE  Result Date: 05/18/2019    ECHOCARDIOGRAM REPORT   Patient Name:   Victor Davies Date of Exam: 05/18/2019 Medical Rec #:  [id number]         Height:       69.0 in Accession #:    [id number]        Weight:       162.3 lb Date of Birth:  [birth year]         BSA:          1.890 m Patient Age:    44 years          BP:           177/115 mmHg Patient Gender: M                 HR:           84 bpm. Exam Location:  Inpatient Procedure: 2D Echo, Color Doppler and Cardiac Doppler Indications:    Stroke i163.9  History:        Patient has no prior history of Echocardiogram examinations.                 Risk Factors:Hypertension.  Sonographer:    Irving BurtonEmily Senior RDCS Referring Phys: [id number] ARSHAD N KAKRAKANDY IMPRESSIONS  1. Left ventricular ejection fraction, by estimation, is 60 to 65%. The left ventricle has normal function. The left ventricle has no regional wall motion abnormalities. There is moderate left ventricular hypertrophy. Left ventricular diastolic parameters are consistent with Grade I diastolic dysfunction (impaired relaxation).  2. Right ventricular systolic function is normal. The right ventricular size is normal.  3. The mitral valve is normal in structure. Trivial mitral valve regurgitation. No evidence of mitral stenosis.  4. The aortic valve is tricuspid. Aortic valve regurgitation is not visualized. No aortic stenosis is present.  5. The inferior vena cava is normal in size with greater than 50% respiratory variability, suggesting right atrial pressure of 3 mmHg. FINDINGS  Left Ventricle: Left ventricular ejection fraction, by estimation, is 60 to 65%. The left ventricle has normal function. The left ventricle has no regional wall motion abnormalities. The left ventricular internal cavity size was normal  in size. There is  moderate left ventricular hypertrophy. Left ventricular diastolic parameters are consistent with Grade  I diastolic dysfunction (impaired relaxation). Right Ventricle: The right ventricular size is normal. No increase in right ventricular wall thickness. Right ventricular systolic function is normal. Left Atrium: Left atrial size was normal in size. Right Atrium: Right atrial size was normal in size. Pericardium: There is no evidence of pericardial effusion. Mitral Valve: The mitral valve is normal in structure. There is mild thickening of the mitral valve leaflet(s). Normal mobility of the mitral valve leaflets. Trivial mitral valve regurgitation. No evidence of mitral valve stenosis. Tricuspid Valve: The tricuspid valve is normal in structure. Tricuspid valve regurgitation is trivial. No evidence of tricuspid stenosis. Aortic Valve: The aortic valve is tricuspid. Aortic valve regurgitation is not visualized. No aortic stenosis is present. Pulmonic Valve: The pulmonic valve was normal in structure. Pulmonic valve regurgitation is not visualized. No evidence of pulmonic stenosis. Aorta: The aortic root is normal in size and structure. Venous: The inferior vena cava is normal in size with greater than 50% respiratory variability, suggesting right atrial pressure of 3 mmHg. IAS/Shunts: No atrial level shunt detected by color flow Doppler.  LEFT VENTRICLE PLAX 2D LVIDd:         4.40 cm  Diastology LVIDs:         2.50 cm  LV e' lateral:   7.62 cm/s LV PW:         1.40 cm  LV E/e' lateral: 8.3 LV IVS:        1.60 cm  LV e' medial:    5.87 cm/s LVOT diam:     2.10 cm  LV E/e' medial:  10.7 LV SV:         91 LV SV Index:   48 LVOT Area:     3.46 cm  RIGHT VENTRICLE RV S prime:     15.90 cm/s TAPSE (M-mode): 2.7 cm LEFT ATRIUM           Index       RIGHT ATRIUM           Index LA diam:      3.70 cm 1.96 cm/m  RA Area:     19.50 cm LA Vol (A2C): 53.0 ml 28.04 ml/m RA Volume:   50.80 ml  26.87 ml/m LA  Vol (A4C): 33.1 ml 17.51 ml/m  AORTIC VALVE LVOT Vmax:   116.00 cm/s LVOT Vmean:  83.700 cm/s LVOT VTI:    0.262 m  AORTA Ao Root diam: 3.60 cm Ao Asc diam:  3.60 cm MITRAL VALVE MV Area (PHT): 2.48 cm    SHUNTS MV Decel Time: 306 msec    Systemic VTI:  0.26 m MV E velocity: 63.00 cm/s  Systemic Diam: 2.10 cm MV A velocity: 80.10 cm/s MV E/A ratio:  0.79 Charlton Haws MD Electronically signed by Charlton Haws MD Signature Date/Time: 05/18/2019/9:58:59 AM    Final     Assessment/Plan: Diagnosis: Cortical infarct right parietal lobe, infarct dorsal pontomedullary junction, multiple remote infarcts and multiple superimposed chronic microhemorrhages  Stroke: Continue secondary stroke prophylaxis and Risk Factor Modification listed below:   Antiplatelet therapy:   Blood Pressure Management:  Continue current medication with prn's with permisive HTN per primary team Statin Agent:   Tobacco abuse: Counsel PT/OT for mobility, ADL training  Labs independently reviewed.  Records reviewed and summated above.  1. Does the need for close, 24 hr/day medical supervision in concert with the patient's rehab needs make it unreasonable for this patient to be served in a less intensive setting? Yes  2. Co-Morbidities  requiring supervision/potential complications: tobacco abuse (counsel), HTN (monitor and provide prns in accordance with increased physical exertion and pain), alcohol abuse (CIWA, monitor for withdrawal), hypokalemia (continue to monitor and replete as necessary), marijuana abuse (counsel) 3. Due to safety, disease management and patient education, does the patient require 24 hr/day rehab nursing? Yes 4. Does the patient require coordinated care of a physician, rehab nurse, therapy disciplines of PT/OT to address physical and functional deficits in the context of the above medical diagnosis(es)? Yes Addressing deficits in the following areas: balance, endurance, locomotion, strength, transferring,  bathing, dressing, toileting and psychosocial support 5. Can the patient actively participate in an intensive therapy program of at least 3 hrs of therapy per day at least 5 days per week? Yes 6. The potential for patient to make measurable gains while on inpatient rehab is excellent 7. Anticipated functional outcomes upon discharge from inpatient rehab are modified independent and supervision  with PT, modified independent and supervision with OT, n/a with SLP. 8. Estimated rehab length of stay to reach the above functional goals is: 8-12 days. 9. Anticipated discharge destination: Home 10. Overall Rehab/Functional Prognosis: good  RECOMMENDATIONS: This patient's condition is appropriate for continued rehabilitative care in the following setting: Recommend CIR for completion of medical work-up, however patient would like to go home.  We will continue to follow. Patient has agreed to participate in recommended program. Potentially Note that insurance prior authorization may be required for reimbursement for recommended care.  Comment: Rehab Admissions Coordinator to follow up.  I have personally performed a face to face diagnostic evaluation, including, but not limited to relevant history and physical exam findings, of this patient and developed relevant assessment and plan.  Additionally, I have reviewed and concur with the physician assistant's documentation above.   Maryla Morrow, MD, ABPM Jacquelynn Cree, PA-C 05/18/2019

## 2019-05-18 NOTE — Progress Notes (Signed)
Echocardiogram 2D Echocardiogram has been performed.  Victor Davies 05/18/2019, 9:44 AM

## 2019-05-18 NOTE — Progress Notes (Addendum)
RN paged NP for noted ST elevation on tele, no chest pain.  Troponin, EKG ordered, EKG showed ST elevation, confirmed with Dr Warrick Parisian on e-Link.  He recommended checking placement of leads and repeating EKG.  Repeat per RN showed NSR, EKG placed in chart.  Reviewed repeat EKG, no significant changes from initial EKG at time of hospitalization, no ST elevation

## 2019-05-19 LAB — BASIC METABOLIC PANEL
Anion gap: 8 (ref 5–15)
BUN: 11 mg/dL (ref 6–20)
CO2: 26 mmol/L (ref 22–32)
Calcium: 9.3 mg/dL (ref 8.9–10.3)
Chloride: 106 mmol/L (ref 98–111)
Creatinine, Ser: 0.86 mg/dL (ref 0.61–1.24)
GFR calc Af Amer: >60 mL/min (ref >60)
GFR calc non Af Amer: >60 mL/min (ref >60)
Glucose, Bld: 97 mg/dL (ref 70–99)
Potassium: 3.9 mmol/L (ref 3.5–5.1)
Sodium: 140 mmol/L (ref 135–145)

## 2019-05-19 LAB — CBC
HCT: 49.9 % (ref 39.0–52.0)
Hemoglobin: 16.7 g/dL (ref 13.0–17.0)
MCH: 30.5 pg (ref 26.0–34.0)
MCHC: 33.5 g/dL (ref 30.0–36.0)
MCV: 91.2 fL (ref 80.0–100.0)
Platelets: 338 10*3/uL (ref 150–400)
RBC: 5.47 MIL/uL (ref 4.22–5.81)
RDW: 12.8 % (ref 11.5–15.5)
WBC: 7.9 10*3/uL (ref 4.0–10.5)
nRBC: 0 % (ref 0.0–0.2)

## 2019-05-19 MED ORDER — CARVEDILOL 3.125 MG PO TABS: 3.1250 mg | ORAL_TABLET | Freq: Two times a day (BID) | ORAL | 0 refills | Status: DC

## 2019-05-19 MED ORDER — ATORVASTATIN CALCIUM 80 MG PO TABS: 80.0000 mg | ORAL_TABLET | Freq: Every day | ORAL | 0 refills | Status: DC

## 2019-05-19 MED ORDER — ASPIRIN 325 MG PO TBEC: 325.0000 mg | DELAYED_RELEASE_TABLET | Freq: Every day | ORAL | 0 refills | Status: AC

## 2019-05-19 MED ORDER — CLOPIDOGREL BISULFATE 75 MG PO TABS: 75.0000 mg | ORAL_TABLET | Freq: Every day | ORAL | 0 refills | Status: DC

## 2019-05-19 MED ORDER — LISINOPRIL 20 MG PO TABS: 20.0000 mg | ORAL_TABLET | Freq: Every day | ORAL | 0 refills | Status: DC

## 2019-05-19 MED ORDER — LISINOPRIL 20 MG PO TABS
20.0000 mg | ORAL_TABLET | Freq: Every day | ORAL | Status: DC
  Administered 2019-05-19: 20 mg via ORAL
  Filled 2019-05-19: qty 1

## 2019-05-19 NOTE — Progress Notes (Signed)
Occupational Therapy Treatment Patient Details Name: Victor Davies MRN: [id number] DOB: 1975-02-27 Today's Date: 05/19/2019    History of present illness 44 y.o. male with history of tobacco abuse, hypertension had a history of alcohol abuse patient states he has not had any alcohol for last 3 to 4 weeks has been experiencing increasing dizziness over the last 4 weeks.  Also has some numbness and tightness of the right upper and lower extremities over the same period of time last 4 weeks.   OT comments  Pt progressing toward established OT goals, updated pt's d/c recommendation to home with 24/7 supervision and outpatient OT. Pt required minguard-minA for functional mobility. Pt educated on fine motor coordination activities and HEP with medium theraputty. Pt will continue to benefit from skilled OT services to maximize safety and independence with ADL/IADL and functional mobility. Will continue to follow acutely and progress as tolerated.    Follow Up Recommendations  Outpatient OT;Supervision/Assistance - 24 hour    Equipment Recommendations  None recommended by OT    Recommendations for Other Services      Precautions / Restrictions Precautions Precautions: Fall Restrictions Weight Bearing Restrictions: No       Mobility Bed Mobility Overal bed mobility: Independent                Transfers Overall transfer level: Needs assistance Equipment used: None Transfers: Sit to/from Stand Sit to Stand: Min guard         General transfer comment: minguard for safety    Balance Overall balance assessment: Needs assistance Sitting-balance support: No upper extremity supported;Feet supported Sitting balance-Leahy Scale: Normal Sitting balance - Comments: supervision   Standing balance support: No upper extremity supported Standing balance-Leahy Scale: Fair Standing balance comment: demonstrated preference for single UE support                            ADL either performed or assessed with clinical judgement   ADL Overall ADL's : Needs assistance/impaired                         Toilet Transfer: Minimal assistance;Ambulation Toilet Transfer Details (indicate cue type and reason): minA for support and stability  Toileting- Clothing Manipulation and Hygiene: Min guard;Sit to/from stand       Functional mobility during ADLs: Minimal assistance General ADL Comments: pt with decreased balance, decreased body awareness, decreased use of RUE, impaired cognition     Vision       Perception     Praxis      Cognition Arousal/Alertness: Awake/alert Behavior During Therapy: WFL for tasks assessed/performed Overall Cognitive Status: Impaired/Different from baseline Area of Impairment: Attention;Memory;Following commands;Safety/judgement;Awareness;Problem solving                   Current Attention Level: Sustained Memory: Decreased short-term memory Following Commands: Follows multi-step commands with increased time;Follows one step commands consistently Safety/Judgement: Decreased awareness of safety;Decreased awareness of deficits Awareness: Emergent Problem Solving: Slow processing;Requires verbal cues General Comments: pt completed trail making part A with increased time and , required assistance to correct mistakes;pt with increased awareness of RUE limitations, asking what he can do to facilitate healing process        Exercises Exercises: Other exercises Other Exercises Other Exercises: provided fine motor activities and HEP with medium theraputty and stress ball Other Exercises: educated pt on strategies to increase awareness and proprioceptiion of RUE  Shoulder Instructions       General Comments reported mild dizziness sitting EOB, subsided after 15seconds    Pertinent Vitals/ Pain       Pain Assessment: No/denies pain  Home Living                                           Prior Functioning/Environment              Frequency  Min 2X/week        Progress Toward Goals  OT Goals(current goals can now be found in the care plan section)  Progress towards OT goals: Progressing toward goals  Acute Rehab OT Goals Patient Stated Goal: to d/c home to see his children OT Goal Formulation: With patient Time For Goal Achievement: 05/31/19 Potential to Achieve Goals: Good ADL Goals Pt Will Perform Grooming: with modified independence;standing Additional ADL Goal #1: Pt will perform three part IADL task with 2-3 cues Additional ADL Goal #2: Pt will perform four part trail making task with 2-3 cues  Plan Discharge plan needs to be updated    Co-evaluation                 AM-PAC OT "6 Clicks" Daily Activity     Outcome Measure   Help from another person eating meals?: A Little Help from another person taking care of personal grooming?: A Little Help from another person toileting, which includes using toliet, bedpan, or urinal?: A Little Help from another person bathing (including washing, rinsing, drying)?: A Little Help from another person to put on and taking off regular upper body clothing?: A Little Help from another person to put on and taking off regular lower body clothing?: A Little 6 Click Score: 18    End of Session    OT Visit Diagnosis: Unsteadiness on feet (R26.81);Other abnormalities of gait and mobility (R26.89);Muscle weakness (generalized) (M62.81);Other (comment)   Activity Tolerance Patient tolerated treatment well   Patient Left in chair;with call bell/phone within reach;with chair alarm set;with family/visitor present   Nurse Communication Mobility status        Time: 2409-7353 OT Time Calculation (min): 29 min  Charges: OT Treatments $Self Care/Home Management : 8-22 mins $Therapeutic Exercise: 8-22 mins  Helene Kelp OTR/L Acute Rehabilitation Services Office: [place] 05/19/2019, 1:03 PM

## 2019-05-19 NOTE — TOC Transition Note (Signed)
Transition of Care [facility]) - CM/SW Discharge Note   Patient Details  Name: Victor Davies MRN: [id number] Date of Birth: [birth year]  [facility] CM/SW Contact:  Bess Kinds, RN Phone Number: [phone] 05/19/2019, 2:07 PM   Clinical Narrative:     Patient to transition home today. HH order for PT. [facility]erral [facility] to [facility] - patient does not qualify d/t having family planning medicaid and recommendations are for outpatient physical and occupational therapy. Patient already connected with [facility]. No further TOC needs identified.   Final next level of care: Home/Self Care Barriers to Discharge: No Barriers Identified   Patient Goals and CMS Choice     Choice offered to / list presented to : Patient, Spouse  Discharge Placement                       Discharge Plan and Services   Discharge Planning Services: CM Consult Post Acute Care Choice: IP Rehab                               Social Determinants of Health (SDOH) Interventions     Readmission Risk Interventions No flowsheet data found.

## 2019-05-19 NOTE — Discharge Summary (Signed)
Physician Discharge Summary  Victor Davies WUJ:[id number] DOB: 1975-09-27 DOA: 05/16/2019  PCP: Marcine Matar, MD  Admit date: 05/16/2019 Discharge date: 05/19/2019  Admitted From: Home Disposition: Home  Recommendations for Outpatient Follow-up:  1. Follow up with PCP in 1-2 weeks 2. Please obtain BMP/CBC in one week  Home Health: Yes, PT Equipment/Devices: None  Discharge Condition: Stable CODE STATUS: Full Diet recommendation: Low-salt low-fat diet  Brief/Interim Summary: Victor Davies a 44 y.o.malewithhistory of tobacco abuse, hypertension had a history of alcohol abuse patient states he has not had any alcohol for last 3 to 4 weeks has been experiencing increasing dizziness over the last 4 weeks. Also has some numbness and tightness of the right upper and lower extremities over the same period of time last 4 weeks. He has recently followed up with his primary care physician for uncontrolled hypertension after the ER visit and lisinopril was added along with his Norvasc. Patient's dizziness and nausea vomiting acutely worsened over the last 24 hours and presents to the ER. Has some difficulty speaking. Denies any weakness of the upper or lower extremities or any visual symptoms.In the ED MRI of the brain shows acute CVA, EKG shows normal sinus rhythm with nonspecific changes. On exam patient is able to move all extremities 5 x 5. On-call neurologist Dr. Otelia Limes was consulted. Labs reveal normal metabolic panel with CBC showing WBC of 10.6 hemoglobin of 17.5. Covid test was negative. Urine drug screen is positive for marijuana. Patient admitted for acute CVA  Patient admitted as above with acute punctate infarct of the brainstem and right parietal lobe with subsequent ambulatory dysfunction and vertiginous symptoms.  Patient was placed on core measures.  Patient is to continue 325 aspirin and Plavix daily for 3 months and then transition to aspirin alone per  neurology, outpatient follow-up per their schedule.  Patient also placed on higher dose of home atorvastatin, lisinopril as well as lengthy discussion at bedside daily with the patient and his wife about need for dietary and lifestyle changes including smoking cessation.  Patient's other routine work-up including echocardiogram were unremarkable.  Blood pressure is moderately well controlled on current regimen.  Patient otherwise stable and agreeable for discharge home, follow closely with cardiology, neurology and PCP.  Discharge Diagnoses:  Principal Problem:   Acute CVA (cerebrovascular accident) [facility]) Active Problems:   Essential hypertension   Acute ischemic stroke (HCC)   Tobacco abuse   Alcohol abuse   Hypokalemia   Marijuana abuse    Discharge Instructions  Discharge Instructions    Ambulatory referral to Neurology   Complete by: As directed    Follow up with stroke clinic NP (Jessica Vanschaick or Darrol Angel, if both not available, consider Manson Allan, or Ahern) at [facility] in about 4 weeks. Thanks.   Call MD for:  difficulty breathing, headache or visual disturbances   Complete by: As directed    Call MD for:  extreme fatigue   Complete by: As directed    Call MD for:  hives   Complete by: As directed    Call MD for:  persistant dizziness or light-headedness   Complete by: As directed    Call MD for:  persistant nausea and vomiting   Complete by: As directed    Call MD for:  severe uncontrolled pain   Complete by: As directed    Call MD for:  temperature >100.4   Complete by: As directed    Diet - low sodium heart healthy   Complete  by: As directed    Increase activity slowly   Complete by: As directed      Allergies as of 05/19/2019   No Known Allergies     Medication List    TAKE these medications   amLODipine 10 MG tablet Commonly known as: NORVASC Take 1 tablet (10 mg total) by mouth daily.   aspirin 325 MG EC tablet Take 1 tablet (325 mg total)  by mouth daily. Start taking on: May 20, 2019 What changed:   medication strength  how much to take   atorvastatin 80 MG tablet Commonly known as: LIPITOR Take 1 tablet (80 mg total) by mouth daily. Start taking on: May 20, 2019 What changed:   medication strength  how much to take   carvedilol 3.125 MG tablet Commonly known as: COREG Take 1 tablet (3.125 mg total) by mouth 2 (two) times daily with a meal.   clopidogrel 75 MG tablet Commonly known as: PLAVIX Take 1 tablet (75 mg total) by mouth daily. Start taking on: May 20, 2019   lisinopril 20 MG tablet Commonly known as: ZESTRIL Take 1 tablet (20 mg total) by mouth daily. Start taking on: May 20, 2019 What changed:   medication strength  how much to take   varenicline 0.5 MG tablet Commonly known as: Chantix Take 1 tablet (0.5 mg total) by mouth 2 (two) times daily. For smoking cessation      Follow-up Information    [facility]. Schedule an appointment as soon as possible for a visit in 4 week(s).   Specialty: Neurology Contact information: [address] [place] [address] [phone]         No Known Allergies  Consultations:  Neurology, cardiology   Procedures/Studies: CT ANGIO HEAD W OR WO CONTRAST  Result Date: 05/17/2019 CLINICAL DATA:  Follow-up examination for acute stroke. EXAM: CT ANGIOGRAPHY HEAD AND NECK TECHNIQUE: Multidetector CT imaging of the head and neck was performed using the standard protocol during bolus administration of intravenous contrast. Multiplanar CT image reconstructions and MIPs were obtained to evaluate the vascular anatomy. Carotid stenosis measurements (when applicable) are obtained utilizing NASCET criteria, using the distal internal carotid diameter as the denominator. CONTRAST:  OMNIPAQUE IOHEXOL 350 MG/ML SOLN COMPARISON:  Prior brain MRI from 05/16/2019. FINDINGS: CT HEAD FINDINGS Brain: Mildly  advanced cerebral atrophy for age. Advanced chronic microvascular ischemic disease with multiple remote lacunar infarcts clustered about the deep gray nuclei and pons, better seen on prior brain MRI. Previously identified small strokes not visible by CT. No intracranial hemorrhage. No mass lesion, midline shift or mass effect. No hydrocephalus or extra-axial fluid collection. Vascular: No appreciable hyperdense vessel, better evaluated on concomitant CTA. Scattered atherosclerotic change at the skull base. Skull: Scalp soft tissues and calvarium within normal limits. Sinuses: Extensive acute on chronic pan sinusitis as previously described again noted. Mastoid air cells remain clear. Orbits: Globes and orbital soft tissues demonstrate no acute finding. Review of the MIP images confirms the above findings CTA NECK FINDINGS Aortic arch: Visualized arch of normal caliber with normal branch pattern. No hemodynamically significant stenosis seen about the origin of the great vessels. Visualized subclavian arteries widely patent. Right carotid system: Right common carotid artery patent from its origin to the bifurcation without stenosis. Mild eccentric calcified plaque at the proximal right ICA without stenosis. Right ICA widely patent distally without stenosis, dissection or occlusion. Left carotid system: Left CCA patent from its origin to the bifurcation without stenosis.  Scattered calcified plaque about the left bifurcation/proximal left ICA with associated stenosis of up to approximately 45-50% by NASCET criteria. Left ICA otherwise patent distally to the skull base without stenosis, dissection, or occlusion. Vertebral arteries: Both vertebral arteries arise from the subclavian arteries. Left vertebral artery dominant, with a diffusely hypoplastic right vertebral artery. Vertebral body res widely patent within the neck without stenosis or other abnormality. Skeleton: No acute osseous finding. No discrete or worrisome  osseous lesions. Poor dentition with innumerable dental caries and periapical lucencies noted. Other neck: No other acute soft tissue abnormality within the neck. No mass lesion or adenopathy. Upper chest: Visualized upper chest demonstrates no acute finding. Review of the MIP images confirms the above findings CTA HEAD FINDINGS Anterior circulation: Petrous segments patent bilaterally. Scattered atheromatous irregularity within the cavernous/supraclinoid ICAs bilaterally, left slightly greater than right. Short-segment moderate stenosis seen involving the anterior genu of the cavernous left ICA (series 11, image 225). No significant narrowing about the right carotid siphon. ICA termini well perfused. A1 segments patent bilaterally. Normal anterior communicating artery complex. Right A2 segment widely patent to its distal aspect. Short-segment severe stenosis seen involving the mid left A2 segment (series 14, image 49). No M1 stenosis or occlusion. Negative MCA bifurcations. Distal MCA branches well perfused and symmetric. Posterior circulation: Dominant left vertebral artery irregular but patent to the vertebrobasilar junction without stenosis. Hypoplastic right vertebral artery largely terminates in PICA, and is either occluded or severely stenotic distally. Vertebrobasilar junction and proximal basilar artery are irregular with a dolichoectatic appearance. The mid and distal basilar artery are diminutive but patent. Superior cerebral arteries grossly patent bilaterally. Predominant fetal type origin of both PCAs. PCAs grossly patent to their distal aspects without stenosis. Venous sinuses: Grossly patent allowing for timing the contrast bolus. Anatomic variants: Predominant fetal type origin of both PCAs. Review of the MIP images confirms the above findings IMPRESSION: CT HEAD IMPRESSION: 1. Advanced chronic microvascular ischemic disease for age with multiple remote lacunar infarcts involving the deep gray  nuclei and pons, better characterized on recent brain MRI. Recently identified subcentimeter infarcts not visible by CT. 2. No other new acute intracranial abnormality. 3. Acute on chronic pansinusitis. CTA HEAD AND NECK IMPRESSION: 1. Negative CTA for large vessel occlusion. 2. 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 The mid and distal basilar artery are diminutive but patent, with predominant fetal type origin of both PCAs. 3. 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 Dominant left vertebral artery patent without high-grade stenosis. 4. Atheromatous plaque about the left carotid bifurcation with estimated 45-50% stenosis by NASCET criteria. 5. Short-segment severe mid left A2 stenosis. Electronically Signed   By: Jeannine Boga M.D.   On: 05/17/2019 03:56   DG Chest 2 View  Result Date: 05/07/2019 CLINICAL DATA:  Chest numbness EXAM: CHEST - 2 VIEW COMPARISON:  None. FINDINGS: No acute consolidation or effusion. Oval opacity projecting over the spine on lateral view. Heart size upper normal. No pneumothorax. IMPRESSION: No active cardiopulmonary disease. Possible oval opacity projecting over the spine on lateral view. Lung parenchymal abnormality cannot be excluded. Chest CT is suggested for further evaluation. Electronically Signed   By: Donavan Foil M.D.   On: 05/07/2019 21:45   CT Head Wo Contrast  Result Date: 05/08/2019 CLINICAL DATA:  Right arm and leg numbness EXAM: CT HEAD WITHOUT CONTRAST TECHNIQUE: Contiguous axial images were obtained from the base  of the skull through the vertex without intravenous contrast. COMPARISON:  None. FINDINGS: Brain: There is no mass, hemorrhage or extra-axial collection. The size and configuration of the ventricles and extra-axial CSF spaces are normal. There is hypoattenuation of the white matter,  most commonly indicating chronic small vessel disease. Multiple white matter lesions in a pattern most suggestive of old small vessel infarcts of the basal ganglia. Vascular: No abnormal hyperdensity of the major intracranial arteries or dural venous sinuses. No intracranial atherosclerosis. Skull: The visualized skull base, calvarium and extracranial soft tissues are normal. Sinuses/Orbits: No fluid levels or advanced mucosal thickening of the visualized paranasal sinuses. No mastoid or middle ear effusion. The orbits are normal. IMPRESSION: Chronic small vessel disease greater than expected for age, with multiple old small vessel infarcts. MRI recommended for further characterization. Electronically Signed   By: Deatra Robinson M.D.   On: 05/08/2019 00:31   CT ANGIO NECK W OR WO CONTRAST  Result Date: 05/17/2019 CLINICAL DATA:  Follow-up examination for acute stroke. EXAM: CT ANGIOGRAPHY HEAD AND NECK TECHNIQUE: Multidetector CT imaging of the head and neck was performed using the standard protocol during bolus administration of intravenous contrast. Multiplanar CT image reconstructions and MIPs were obtained to evaluate the vascular anatomy. Carotid stenosis measurements (when applicable) are obtained utilizing NASCET criteria, using the distal internal carotid diameter as the denominator. CONTRAST:  OMNIPAQUE IOHEXOL 350 MG/ML SOLN COMPARISON:  Prior brain MRI from 05/16/2019. FINDINGS: CT HEAD FINDINGS Brain: Mildly advanced cerebral atrophy for age. Advanced chronic microvascular ischemic disease with multiple remote lacunar infarcts clustered about the deep gray nuclei and pons, better seen on prior brain MRI. Previously identified small strokes not visible by CT. No intracranial hemorrhage. No mass lesion, midline shift or mass effect. No hydrocephalus or extra-axial fluid collection. Vascular: No appreciable hyperdense vessel, better evaluated on concomitant CTA. Scattered atherosclerotic change at  the skull base. Skull: Scalp soft tissues and calvarium within normal limits. Sinuses: Extensive acute on chronic pan sinusitis as previously described again noted. Mastoid air cells remain clear. Orbits: Globes and orbital soft tissues demonstrate no acute finding. Review of the MIP images confirms the above findings CTA NECK FINDINGS Aortic arch: Visualized arch of normal caliber with normal branch pattern. No hemodynamically significant stenosis seen about the origin of the great vessels. Visualized subclavian arteries widely patent. Right carotid system: Right common carotid artery patent from its origin to the bifurcation without stenosis. Mild eccentric calcified plaque at the proximal right ICA without stenosis. Right ICA widely patent distally without stenosis, dissection or occlusion. Left carotid system: Left CCA patent from its origin to the bifurcation without stenosis. Scattered calcified plaque about the left bifurcation/proximal left ICA with associated stenosis of up to approximately 45-50% by NASCET criteria. Left ICA otherwise patent distally to the skull base without stenosis, dissection, or occlusion. Vertebral arteries: Both vertebral arteries arise from the subclavian arteries. Left vertebral artery dominant, with a diffusely hypoplastic right vertebral artery. Vertebral body res widely patent within the neck without stenosis or other abnormality. Skeleton: No acute osseous finding. No discrete or worrisome osseous lesions. Poor dentition with innumerable dental caries and periapical lucencies noted. Other neck: No other acute soft tissue abnormality within the neck. No mass lesion or adenopathy. Upper chest: Visualized upper chest demonstrates no acute finding. Review of the MIP images confirms the above findings CTA HEAD FINDINGS Anterior circulation: Petrous segments patent bilaterally. Scattered atheromatous irregularity within the cavernous/supraclinoid ICAs bilaterally, left slightly  greater than right.  Short-segment moderate stenosis seen involving the anterior genu of the cavernous left ICA (series 11, image 225). No significant narrowing about the right carotid siphon. ICA termini well perfused. A1 segments patent bilaterally. Normal anterior communicating artery complex. Right A2 segment widely patent to its distal aspect. Short-segment severe stenosis seen involving the mid left A2 segment (series 14, image 49). No M1 stenosis or occlusion. Negative MCA bifurcations. Distal MCA branches well perfused and symmetric. Posterior circulation: Dominant left vertebral artery irregular but patent to the vertebrobasilar junction without stenosis. Hypoplastic right vertebral artery largely terminates in PICA, and is either occluded or severely stenotic distally. Vertebrobasilar junction and proximal basilar artery are irregular with a dolichoectatic appearance. The mid and distal basilar artery are diminutive but patent. Superior cerebral arteries grossly patent bilaterally. Predominant fetal type origin of both PCAs. PCAs grossly patent to their distal aspects without stenosis. Venous sinuses: Grossly patent allowing for timing the contrast bolus. Anatomic variants: Predominant fetal type origin of both PCAs. Review of the MIP images confirms the above findings IMPRESSION: CT HEAD IMPRESSION: 1. Advanced chronic microvascular ischemic disease for age with multiple remote lacunar infarcts involving the deep gray nuclei and pons, better characterized on recent brain MRI. Recently identified subcentimeter infarcts not visible by CT. 2. No other new acute intracranial abnormality. 3. Acute on chronic pansinusitis. CTA HEAD AND NECK IMPRESSION: 1. Negative CTA for large vessel occlusion. 2. 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 The mid and distal basilar artery are diminutive but patent, with predominant fetal type origin of both PCAs. 3.  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 Dominant left vertebral artery patent without high-grade stenosis. 4. Atheromatous plaque about the left carotid bifurcation with estimated 45-50% stenosis by NASCET criteria. 5. Short-segment severe mid left A2 stenosis. Electronically Signed   By: Rise Mu M.D.   On: 05/17/2019 03:56   CT CHEST WO CONTRAST  Result Date: 05/17/2019 CLINICAL DATA:  Lung nodule.  Abnormal chest x-ray. EXAM: CT CHEST WITHOUT CONTRAST TECHNIQUE: Multidetector CT imaging of the chest was performed following the standard protocol without IV contrast. COMPARISON:  Two-view chest x-ray 05/07/2019 FINDINGS: Cardiovascular: Heart size is normal. Left mainstem coronary artery calcification is present. Minimal calcifications are present at the aortic arch. No significant stenosis is present. No aneurysm is present. Pulmonary artery size is normal. Mediastinum/Nodes: No significant mediastinal, hilar, or axillary adenopathy present. Lungs/Pleura: Are clear without focal nodule, mass, or airspace disease. Upper Abdomen: Punctate nonobstructing stone is present in the right kidney. Water density 16 mm exophytic lesion in the left kidney likely represents a simple cyst. No other focal lesions are present. Musculoskeletal: Prominent syndesmophyte on the right at C5-6 corresponds to the density seen on the chest x-ray. Other focal osseous lesions are present. Vertebral body heights are maintained. IMPRESSION: 1. Prominent right-sided syndesmophyte corresponds to the density seen on the chest x-ray. No follow-up needed. 2. Calcification at the level of the left main coronary artery. Recommend cardiology consultation. 3. No pulmonary nodule. Electronically Signed   By: Marin Roberts M.D.   On: 05/17/2019 07:57   MR Brain Wo Contrast (neuro protocol)  Result Date:  05/16/2019 CLINICAL DATA:  Initial evaluation for acute dizziness. EXAM: MRI HEAD WITHOUT CONTRAST TECHNIQUE: Multiplanar, multiecho pulse sequences of the brain and surrounding structures were obtained without intravenous contrast. COMPARISON:  Prior head CT from 05/08/2019. FINDINGS: Brain: Generalized age-related cerebral atrophy.  Advanced chronic small vessel ischemic disease with innumerable remote lacunar infarcts seen clustered about the bilateral basal ganglia, thalami, and pons. Few additional tiny remote bilateral cerebellar infarcts noted. Superimposed scattered multiple foci of susceptibility artifact consistent with chronic microhemorrhages, likely related to poorly controlled hypertension. 6 mm focus of restricted diffusion involving the right parietal cortex consistent with a small acute ischemic infarct (series 5, image 78). No associated hemorrhage or mass effect. There is an additional punctate focus of diffusion abnormality involving the dorsal brainstem at the pontomedullary junction (series 5, image 59), which could reflect an additional small acute to subacute ischemic infarct. Difficult to be certain this finding given the adjacent chronic micro hemorrhages in susceptibility artifact. No associated acute hemorrhage or mass effect. No other evidence for acute or subacute ischemia. Gray-white matter differentiation otherwise maintained. No mass lesion, midline shift, or mass effect. No hydrocephalus or extra-axial fluid collection. Pituitary gland suprasellar region within normal limits. Midline structures intact. Vascular: Right vertebral artery hypoplastic with loss of normal flow void, likely occluded. Probable atherosclerotic change about the vertebrobasilar junction and proximal basilar artery. Major intracranial vascular flow voids otherwise maintained. Skull and upper cervical spine: Craniocervical junction within normal limits. Bone marrow signal intensity normal. No scalp soft tissue  abnormality. Sinuses/Orbits: Globes and orbital soft tissues within normal limits. Extensive mucosal thickening in opacity seen throughout the paranasal sinuses, greatest within the frontoethmoidal sinuses. Few scattered superimposed air-fluid levels noted within the sphenoid sinuses. No mastoid effusion. Inner ear structures grossly normal. Other: None. IMPRESSION: 1. 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 2. Additional punctate focus of diffusion abnormality involving the dorsal brainstem at the pontomedullary junction, which could reflect an acute to early subacute ischemic infarct or possibly artifact. Difficult to be certain given adjacent susceptibility artifact within this region. 3. 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 4. Multiple superimposed chronic micro hemorrhages clustered about the deep gray nuclei and brainstem, likely related to poorly controlled hypertension. 5. Acute on chronic pansinusitis, likely allergic/inflammatory in nature. Electronically Signed   By: Rise Mu M.D.   On: 05/16/2019 23:49   ECHOCARDIOGRAM COMPLETE  Result Date: 05/18/2019    ECHOCARDIOGRAM REPORT   Patient Name:   KEIJUAN SCHELLHASE Date of Exam: 05/18/2019 Medical Rec #:  [id number]         Height:       69.0 in Accession #:    [id number]        Weight:       162.3 lb Date of Birth:  1975-12-24         BSA:          1.890 m Patient Age:    44 years          BP:           177/115 mmHg Patient Gender: M                 HR:           84 bpm. Exam Location:  Inpatient Procedure: 2D Echo, Color Doppler and Cardiac Doppler Indications:    Stroke i163.9  History:        Patient has no prior history of Echocardiogram examinations.                 Risk Factors:Hypertension.  Sonographer:    Irving Burton Senior RDCS Referring Phys:  [id number]  ARSHAD N KAKRAKANDY IMPRESSIONS  1. Left ventricular ejection fraction, by estimation, is 60 to 65%. The left ventricle has normal function. The left ventricle has no regional wall motion abnormalities. There is moderate left ventricular hypertrophy. Left ventricular diastolic parameters are consistent with Grade I diastolic dysfunction (impaired relaxation).  2. Right ventricular systolic function is normal. The right ventricular size is normal.  3. The mitral valve is normal in structure. Trivial mitral valve regurgitation. No evidence of mitral stenosis.  4. The aortic valve is tricuspid. Aortic valve regurgitation is not visualized. No aortic stenosis is present.  5. The inferior vena cava is normal in size with greater than 50% respiratory variability, suggesting right atrial pressure of 3 mmHg. FINDINGS  Left Ventricle: Left ventricular ejection fraction, by estimation, is 60 to 65%. The left ventricle has normal function. The left ventricle has no regional wall motion abnormalities. The left ventricular internal cavity size was normal in size. There is  moderate left ventricular hypertrophy. Left ventricular diastolic parameters are consistent with Grade I diastolic dysfunction (impaired relaxation). Right Ventricle: The right ventricular size is normal. No increase in right ventricular wall thickness. Right ventricular systolic function is normal. Left Atrium: Left atrial size was normal in size. Right Atrium: Right atrial size was normal in size. Pericardium: There is no evidence of pericardial effusion. Mitral Valve: The mitral valve is normal in structure. There is mild thickening of the mitral valve leaflet(s). Normal mobility of the mitral valve leaflets. Trivial mitral valve regurgitation. No evidence of mitral valve stenosis. Tricuspid Valve: The tricuspid valve is normal in structure. Tricuspid valve regurgitation is trivial. No evidence of tricuspid stenosis. Aortic Valve: The aortic valve is  tricuspid. Aortic valve regurgitation is not visualized. No aortic stenosis is present. Pulmonic Valve: The pulmonic valve was normal in structure. Pulmonic valve regurgitation is not visualized. No evidence of pulmonic stenosis. Aorta: The aortic root is normal in size and structure. Venous: The inferior vena cava is normal in size with greater than 50% respiratory variability, suggesting right atrial pressure of 3 mmHg. IAS/Shunts: No atrial level shunt detected by color flow Doppler.  LEFT VENTRICLE PLAX 2D LVIDd:         4.40 cm  Diastology LVIDs:         2.50 cm  LV e' lateral:   7.62 cm/s LV PW:         1.40 cm  LV E/e' lateral: 8.3 LV IVS:        1.60 cm  LV e' medial:    5.87 cm/s LVOT diam:     2.10 cm  LV E/e' medial:  10.7 LV SV:         91 LV SV Index:   48 LVOT Area:     3.46 cm  RIGHT VENTRICLE RV S prime:     15.90 cm/s TAPSE (M-mode): 2.7 cm LEFT ATRIUM           Index       RIGHT ATRIUM           Index LA diam:      3.70 cm 1.96 cm/m  RA Area:     19.50 cm LA Vol (A2C): 53.0 ml 28.04 ml/m RA Volume:   50.80 ml  26.87 ml/m LA Vol (A4C): 33.1 ml 17.51 ml/m  AORTIC VALVE LVOT Vmax:   116.00 cm/s LVOT Vmean:  83.700 cm/s LVOT VTI:    0.262 m  AORTA Ao Root diam: 3.60 cm Ao Asc  diam:  3.60 cm MITRAL VALVE MV Area (PHT): 2.48 cm    SHUNTS MV Decel Time: 306 msec    Systemic VTI:  0.26 m MV E velocity: 63.00 cm/s  Systemic Diam: 2.10 cm MV A velocity: 80.10 cm/s MV E/A ratio:  0.79 Charlton HawsPeter Nishan MD Electronically signed by Charlton HawsPeter Nishan MD Signature Date/Time: 05/18/2019/9:58:59 AM    Final     Subjective: No acute issues or events overnight, tingling and numbness in the right upper and lower extremities is improving but not yet resolved, otherwise denies chest pain, headache, fevers, chills, nausea, vomiting, diarrhea, constipation.   Discharge Exam: Vitals:   05/19/19 0443 05/19/19 0801  BP: (!) 166/105 (!) 155/97  Pulse: 66 69  Resp: 18 18  Temp: 98.1 F (36.7 C) 98.1 F (36.7 C)   SpO2: 100% 100%   Vitals:   05/18/19 1927 05/18/19 2347 05/19/19 0443 05/19/19 0801  BP: (!) 149/105 (!) 152/98 (!) 166/105 (!) 155/97  Pulse: 87 73 66 69  Resp: 19 19 18 18   Temp: 97.7 F (36.5 C) 97.7 F (36.5 C) 98.1 F (36.7 C) 98.1 F (36.7 C)  TempSrc: Oral Oral Oral Oral  SpO2: 97% 100% 100% 100%  Weight:      Height:        General:  Pleasantly resting in bed, No acute distress. HEENT:  Normocephalic atraumatic.  Sclerae nonicteric, noninjected.  Extraocular movements intact bilaterally. Neck:  Without mass or deformity.  Trachea is midline. Lungs:  Clear to auscultate bilaterally without rhonchi, wheeze, or rales. Heart:  Regular rate and rhythm.  Without murmurs, rubs, or gallops. Abdomen:  Soft, nontender, nondistended.  Without guarding or rebound. Extremities: Without cyanosis, clubbing, edema, or obvious deformity.  Minimally decreased sensation over the right upper and lower extremities Vascular:  Dorsalis pedis and posterior tibial pulses palpable bilaterally. Skin:  Warm and dry, no erythema, no ulcerations.   The results of significant diagnostics from this hospitalization (including imaging, microbiology, ancillary and laboratory) are listed below for reference.     Microbiology: Recent Results (from the past 240 hour(s))  Respiratory Panel by RT PCR (Flu A&B, Covid) - Nasopharyngeal Swab     Status: None   Collection Time: 05/17/19  1:01 AM   Specimen: Nasopharyngeal Swab  Result Value Ref Range Status   SARS Coronavirus 2 by RT PCR NEGATIVE NEGATIVE Final    Comment: (NOTE) SARS-CoV-2 target nucleic acids are NOT DETECTED. The SARS-CoV-2 RNA is generally detectable in upper respiratoy specimens during the acute phase of infection. The lowest concentration of SARS-CoV-2 viral copies this assay can detect is 131 copies/mL. A negative result does not preclude SARS-Cov-2 infection and should not be used as the sole basis for treatment or other patient  management decisions. A negative result may occur with  improper specimen collection/handling, submission of specimen other than nasopharyngeal swab, presence of viral mutation(s) within the areas targeted by this assay, and inadequate number of viral copies (<131 copies/mL). A negative result must be combined with clinical observations, patient history, and epidemiological information. The expected result is Negative. Fact Sheet for Patients:  https://www.moore.com/https://www.fda.gov/media/142436/download Fact Sheet for Healthcare Providers:  https://www.young.biz/https://www.fda.gov/media/142435/download This test is not yet ap proved or cleared by the [place] FDA and  has been authorized for detection and/or diagnosis of SARS-CoV-2 by FDA under an Emergency Use Authorization (EUA). This EUA will remain  in effect (meaning this test can be used) for the duration of the COVID-19 declaration under Section 564(b)(1) of the  Act, 21 U.S.C. section 360bbb-3(b)(1), unless the authorization is terminated or revoked sooner.    Influenza A by PCR NEGATIVE NEGATIVE Final   Influenza B by PCR NEGATIVE NEGATIVE Final    Comment: (NOTE) The Xpert Xpress SARS-CoV-2/FLU/RSV assay is intended as an aid in  the diagnosis of influenza from Nasopharyngeal swab specimens and  should not be used as a sole basis for treatment. Nasal washings and  aspirates are unacceptable for Xpert Xpress SARS-CoV-2/FLU/RSV  testing. Fact Sheet for Patients: https://www.moore.com/ Fact Sheet for Healthcare Providers: https://www.young.biz/ This test is not yet approved or cleared by the [place] FDA and  has been authorized for detection and/or diagnosis of SARS-CoV-2 by  FDA under an Emergency Use Authorization (EUA). This EUA will remain  in effect (meaning this test can be used) for the duration of the  Covid-19 declaration under Section 564(b)(1) of the Act, 21  U.S.C. section 360bbb-3(b)(1), unless the  authorization is  terminated or revoked. Performed at [facility], [address], [place], [place] [address]      Labs: BNP (last 3 results) No results for input(s): BNP in the last 8760 hours. Basic Metabolic Panel: Recent Labs  Lab 05/16/19 2004 05/17/19 0101 05/17/19 0617 05/18/19 0525 05/19/19 0435  NA 140  --  137 139 140  K 4.2  --  3.6 3.4* 3.9  CL 103  --  104 103 106  CO2 26  --  24 25 26   GLUCOSE 116*  --  100* 97 97  BUN 13  --  9 8 11   CREATININE 0.85 0.52* 0.72 0.71 0.86  CALCIUM 9.3  --  9.1 9.2 9.3   Liver Function Tests: Recent Labs  Lab 05/16/19 2004 05/17/19 0617  AST 19 15  ALT 22 17  ALKPHOS 67 60  BILITOT 1.0 1.4*  PROT 8.0 7.1  ALBUMIN 4.3 3.8   Recent Labs  Lab 05/16/19 2004  LIPASE 22   No results for input(s): AMMONIA in the last 168 hours. CBC: Recent Labs  Lab 05/16/19 2004 05/17/19 0101 05/17/19 0617 05/18/19 0525 05/19/19 0435  WBC 10.6* 10.3 10.0 8.1 7.9  NEUTROABS  --   --  6.7  --   --   HGB 17.5* 16.6 16.9 16.5 16.7  HCT 53.2* 49.7 51.1 49.6 49.9  MCV 93.3 91.9 92.1 90.0 91.2  PLT 342 339 321 330 338   Cardiac Enzymes: No results for input(s): CKTOTAL, CKMB, CKMBINDEX, TROPONINI in the last 168 hours. BNP: Invalid input(s): POCBNP CBG: No results for input(s): GLUCAP in the last 168 hours. D-Dimer No results for input(s): DDIMER in the last 72 hours. Hgb A1c Recent Labs    05/17/19 0101  HGBA1C 5.5   Lipid Profile Recent Labs    05/17/19 0101  CHOL 105  HDL 37*  LDLCALC 63  TRIG 26  CHOLHDL 2.8   Thyroid function studies No results for input(s): TSH, T4TOTAL, T3FREE, THYROIDAB in the last 72 hours.  Invalid input(s): FREET3 Anemia work up No results for input(s): VITAMINB12, FOLATE, FERRITIN, TIBC, IRON, RETICCTPCT in the last 72 hours. Urinalysis    Component Value Date/Time   COLORURINE YELLOW 05/16/2019 2004   APPEARANCEUR CLEAR 05/16/2019 2004   LABSPEC 1.015  05/16/2019 2004   PHURINE 6.0 05/16/2019 2004   GLUCOSEU 150 (A) 05/16/2019 2004   HGBUR NEGATIVE 05/16/2019 2004   BILIRUBINUR NEGATIVE 05/16/2019 2004   KETONESUR 20 (A) 05/16/2019 2004   PROTEINUR NEGATIVE 05/16/2019 2004   NITRITE  NEGATIVE 05/16/2019 2004   LEUKOCYTESUR NEGATIVE 05/16/2019 2004   Sepsis Labs Invalid input(s): PROCALCITONIN,  WBC,  LACTICIDVEN Microbiology Recent Results (from the past 240 hour(s))  Respiratory Panel by RT PCR (Flu A&B, Covid) - Nasopharyngeal Swab     Status: None   Collection Time: 05/17/19  1:01 AM   Specimen: Nasopharyngeal Swab  Result Value Ref Range Status   SARS Coronavirus 2 by RT PCR NEGATIVE NEGATIVE Final    Comment: (NOTE) SARS-CoV-2 target nucleic acids are NOT DETECTED. The SARS-CoV-2 RNA is generally detectable in upper respiratoy specimens during the acute phase of infection. The lowest concentration of SARS-CoV-2 viral copies this assay can detect is 131 copies/mL. A negative result does not preclude SARS-Cov-2 infection and should not be used as the sole basis for treatment or other patient management decisions. A negative result may occur with  improper specimen collection/handling, submission of specimen other than nasopharyngeal swab, presence of viral mutation(s) within the areas targeted by this assay, and inadequate number of viral copies (<131 copies/mL). A negative result must be combined with clinical observations, patient history, and epidemiological information. The expected result is Negative. Fact Sheet for Patients:  https://www.moore.com/ Fact Sheet for Healthcare Providers:  https://www.young.biz/ This test is not yet ap proved or cleared by the [place] FDA and  has been authorized for detection and/or diagnosis of SARS-CoV-2 by FDA under an Emergency Use Authorization (EUA). This EUA will remain  in effect (meaning this test can be used) for the duration of  the COVID-19 declaration under Section 564(b)(1) of the Act, 21 U.S.C. section 360bbb-3(b)(1), unless the authorization is terminated or revoked sooner.    Influenza A by PCR NEGATIVE NEGATIVE Final   Influenza B by PCR NEGATIVE NEGATIVE Final    Comment: (NOTE) The Xpert Xpress SARS-CoV-2/FLU/RSV assay is intended as an aid in  the diagnosis of influenza from Nasopharyngeal swab specimens and  should not be used as a sole basis for treatment. Nasal washings and  aspirates are unacceptable for Xpert Xpress SARS-CoV-2/FLU/RSV  testing. Fact Sheet for Patients: https://www.moore.com/ Fact Sheet for Healthcare Providers: https://www.young.biz/ This test is not yet approved or cleared by the [place] FDA and  has been authorized for detection and/or diagnosis of SARS-CoV-2 by  FDA under an Emergency Use Authorization (EUA). This EUA will remain  in effect (meaning this test can be used) for the duration of the  Covid-19 declaration under Section 564(b)(1) of the Act, 21  U.S.C. section 360bbb-3(b)(1), unless the authorization is  terminated or revoked. Performed at [facility], [address] [place], [place] [address]      Time coordinating discharge: Over 30 minutes  SIGNED:   Azucena Fallen, DO [facility] 05/19/2019, 11:38 AM Pager   If 7PM-7AM, please contact night-coverage [email or web address]

## 2019-05-19 NOTE — Progress Notes (Signed)
Discharge instructions gone over with patient and wife at bedside, all questions answered. IV removed and Telemetry discontinued. Patient belongings sent home with patient. Patient transported off of unit via wheelchair. Melony Overly, RN

## 2019-05-19 NOTE — Progress Notes (Signed)
Physical Therapy Treatment Patient Details Name: Victor Davies MRN: [id number] DOB: [birth year] Today's Date: 05/19/2019    History of Present Illness 44 y.o. male with history of tobacco abuse, hypertension had a history of alcohol abuse patient states he has not had any alcohol for last 3 to 4 weeks has been experiencing increasing dizziness over the last 4 weeks.  Also has some numbness and tightness of the right upper and lower extremities over the same period of time last 4 weeks.    PT Comments    Patient continues to make steady progress with safety and functional mobility. Wife present today, very supportive with a lot of questions about post acute rehabilitation which we discussed. He will still greatly benefit from skilled PT in outpatient setting where he can continue to receive OT and SLP as needed, in a neuro setting. Adequate from PT standpoint for d/c once medically released.  Follow Up Recommendations  Outpatient PT;Supervision - Intermittent     Equipment Recommendations  None recommended by PT    Recommendations for Other Services       Precautions / Restrictions Precautions Precautions: Fall Restrictions Weight Bearing Restrictions: No    Mobility  Bed Mobility Overal bed mobility: Independent             General bed mobility comments: In recliner  Transfers Overall transfer level: Needs assistance Equipment used: None Transfers: Sit to/from Stand Sit to Stand: Supervision         General transfer comment: supervision for safety. no overt LOB noted  Ambulation/Gait Ambulation/Gait assistance: Supervision Gait Distance (Feet): 300 Feet Assistive device: None Gait Pattern/deviations: Step-through pattern;Drifts right/left   Gait velocity interpretation: >2.62 ft/sec, indicative of community ambulatory General Gait Details: Supervision for safety with slight Lt lateral drift but aware of objects and able to self correct. Tolerated higher  level dynamic challenges well without physical assistance.   Stairs         General stair comments: Declines - practiced yesterday and feels comfortable   Wheelchair Mobility    Modified Rankin (Stroke Patients Only) Modified Rankin (Stroke Patients Only) Pre-Morbid Rankin Score: No significant disability Modified Rankin: Moderate disability     Balance Overall balance assessment: Needs assistance Sitting-balance support: No upper extremity supported;Feet supported Sitting balance-Leahy Scale: Normal Sitting balance - Comments: supervision   Standing balance support: No upper extremity supported Standing balance-Leahy Scale: Fair Standing balance comment: demonstrated preference for single UE support                     Dynamic Gait Index Level Surface: Mild Impairment Change in Gait Speed: Mild Impairment Gait with Horizontal Head Turns: Mild Impairment Gait with Vertical Head Turns: Mild Impairment Gait and Pivot Turn: Mild Impairment Step Over Obstacle: Mild Impairment Step Around Obstacles: Mild Impairment Steps: Mild Impairment Total Score: 16      Cognition Arousal/Alertness: Awake/alert Behavior During Therapy: WFL for tasks assessed/performed Overall Cognitive Status: Impaired/Different from baseline Area of Impairment: Attention;Memory;Following commands;Safety/judgement;Awareness;Problem solving                   Current Attention Level: Sustained Memory: Decreased short-term memory Following Commands: Follows multi-step commands with increased time;Follows one step commands consistently Safety/Judgement: Decreased awareness of safety;Decreased awareness of deficits Awareness: Emergent Problem Solving: Slow processing;Requires verbal cues General Comments: recalled room number and location. forgot chair alarm was set.       Exercises Other Exercises Other Exercises: provided fine motor activities and HEP with medium  theraputty and  stress ball Other Exercises: educated pt on strategies to increase awareness and proprioceptiion of RUE    General Comments General comments (skin integrity, edema, etc.): Mild dizziness reported while seated in chair only.      Pertinent Vitals/Pain Pain Assessment: No/denies pain    Home Living                      Prior Function            PT Goals (current goals can now be found in the care plan section) Acute Rehab PT Goals Patient Stated Goal: to d/c home to see his children PT Goal Formulation: With patient Time For Goal Achievement: 05/31/19 Potential to Achieve Goals: Good Progress towards PT goals: Progressing toward goals    Frequency    Min 4X/week      PT Plan Current plan remains appropriate    Co-evaluation              AM-PAC PT "6 Clicks" Mobility   Outcome Measure  Help needed turning from your back to your side while in a flat bed without using bedrails?: None Help needed moving from lying on your back to sitting on the side of a flat bed without using bedrails?: None Help needed moving to and from a bed to a chair (including a wheelchair)?: None Help needed standing up from a chair using your arms (e.g., wheelchair or bedside chair)?: None Help needed to walk in hospital room?: A Little Help needed climbing 3-5 steps with a railing? : None 6 Click Score: 23    End of Session Equipment Utilized During Treatment: Gait belt Activity Tolerance: Patient tolerated treatment well Patient left: in chair;with call bell/phone within reach;with family/visitor present;with chair alarm set Nurse Communication: Mobility status PT Visit Diagnosis: Other symptoms and signs involving the nervous system (R29.898);Other abnormalities of gait and mobility (R26.89)     Time: 7517-0017 PT Time Calculation (min) (ACUTE ONLY): 14 min  Charges:  $Gait Training: 8-22 mins                     [employer], PT    Berton Mount 05/19/2019, 2:02 PM

## 2019-05-21 ENCOUNTER — Telehealth: Payer: Self-pay

## 2019-05-21 ENCOUNTER — Encounter: Payer: Self-pay | Admitting: Internal Medicine

## 2019-05-21 NOTE — Telephone Encounter (Signed)
Transition Care Management Follow-up Telephone Call.  Call placed to patient # [phone], his wife , Victor Davies, answered and she was at work.  No DPR on file to speak with her.  She provided the # [phone] to contact patient.  Call placed to the patient and he gave authorization to speak to Victor Davies if needed. Explained to him that he can fill out a release of information document when he is in the clinic allowing the staff to release information to his wife and he said he understood.   Date of discharge and from where: 05/19/2019. [facility]   How have you been since you were released from the hospital? He said he was " feeling good."   Any questions or concerns?  he did not have any concerns.   His wife asked about him receiving his second COVID vaccine tomorrow. She noted that he just had a stroke and she was concerned. She had sent a message to Dr Laural Benes.  Explained to her that as per Dr Laural Benes, she can re-schedule for him to receive the vaccine in 2 more weeks.    Items Reviewed:  Did the pt receive and understand the discharge instructions provided?  he said yes  Medications obtained and verified?  he said that he has the medication instructions and has all of the medications.  No questions at this time  Any new allergies since your discharge?  none reported   Do you have support at home?  yes his wife, Victor Davies.  He also said that they have a 45 yo and 45 yo.   Other (ie: DME, Home Health, etc) no home health or DME ordered  Functional Questionnaire: (I = Independent and D = Dependent) ADL's: independent.  Wife assists with medication management   Follow up appointments reviewed:    PCP Hospital f/u appt confirmed?[facility] Laural Benes 06/05/2019 @ 0930  Specialist Hospital f/u appt confirmed?  neurology - 06/11/2019..  Are transportation arrangements needed?  no, he stated that his wife provides transportation  If their condition worsens, is the pt aware to call   their PCP or go to the ED?  yes. He said that they also told him that in the hospital.   Was the patient provided with contact information for the PCP's office or ED? he has the clinic phone number and MyChart  Was the pt encouraged to call back with questions or concerns?  yes.  He was also instructed to have his wife call with questions.

## 2019-05-25 ENCOUNTER — Encounter: Payer: Self-pay | Admitting: *Deleted

## 2019-05-25 ENCOUNTER — Other Ambulatory Visit: Payer: Self-pay | Admitting: *Deleted

## 2019-05-25 NOTE — Patient Outreach (Signed)
[facility]) Care Management  05/25/2019  Cordell Villeda 1976/01/14 [id number]   EMMI-stroke Discharged from [place] not on apl RED ON EMMI ALERT Day # 1 Date: Monday 05/21/19 1301  Red Alert Reason: scheduled a follow up appointment? No    Insurance: medicaid family planning  [facility] admissions x 1 ED visits x 1 in the last 6 months    Outreach attempt # 1 successful to [phone] Patient is able to verify HIPAA, DOB and address [facility] Care Management RN reviewed and addressed red alert with patient [facility] RN CM spoke with him and his wife Kathline Magic   EMMI:  appointment was not scheduled on 05/21/19 at the time of the [facility] outreach call It was scheduled after the 05/21/19 EMMI call He has an upcoming primary care provider (PCP) appointment on 06/05/19(virtual visit)  He is reported to be able to walk better/ He is trying to increase his activity He is reported to have pain in is right hand  [facility] RN CM interventions [facility] RN CM answered question for him and his wife related to medicaid Discussed medicaid family planning, medicaid [place] access and medicaid of [place] Discussed that Mr Wynter will still be able to be seen at [facility] health [facility] wellness center) no matter what his coverage is.  The contact number for [facility] home [facility] ([phone]) was provided to contact about the status of the home health referral. [facility] RN CM discussed with Kathline Magic that the hospital notes indicates a referral was made to [facility]. At the time of this outreach the home health agency office is closed   Sent an e-mail to [facility] health [facility] wellness center) staff about the inquiry about applying and/or the status of a new medicaid coverage vs present family planning medicaid   EMMI information related to low salt diet, lifestyle changes to lower blood pressure and stroke, preventing secondary stroke ws sent to the e mail account listed with    Social: Mr  Minshall is a 44 year old male who lives with his wife Kathline Magic and children He is a father to 2 boys - 70 and 57 yo he is independent with  Activities of daily living (ADLs) and need assist with  IADLs (instrumental Activities of Daily Living) His wife provides transportation    Conditions: 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 Patient gave verbal consent for services [facility] telephonic RN CM.   Advised patient that there will be further automated EMMI- post discharge calls to assess how the patient is doing following the recent hospitalization Advised the patient that another call may be received from a nurse if any of their responses were abnormal. Patient voiced understanding and was appreciative of f/u call.   Plan: [facility] RN CM will follow up with Mr & Mrs Bendix within the next 14-21 business days and plans for case closure if no further needs identified Pt encouraged to return a call to [place] CM prn Routed note to MD   Joelene Millin L. Lavina Hamman,  RN, BSN, CCM [facility] Telephonic Care Management Care Coordinator Office number [phone] Mobile number [phone]  Main [facility] number [phone] Fax number [phone]

## 2019-06-02 ENCOUNTER — Encounter: Payer: Self-pay | Admitting: Internal Medicine

## 2019-06-05 ENCOUNTER — Other Ambulatory Visit: Payer: Self-pay | Admitting: Internal Medicine

## 2019-06-05 ENCOUNTER — Encounter: Payer: Self-pay | Admitting: Internal Medicine

## 2019-06-05 ENCOUNTER — Other Ambulatory Visit: Payer: Self-pay

## 2019-06-05 ENCOUNTER — Ambulatory Visit: Payer: Self-pay | Attending: Internal Medicine | Admitting: Internal Medicine

## 2019-06-05 ENCOUNTER — Other Ambulatory Visit: Payer: Self-pay | Admitting: *Deleted

## 2019-06-05 DIAGNOSIS — I10 Essential (primary) hypertension: Secondary | ICD-10-CM

## 2019-06-05 DIAGNOSIS — Z09 Encounter for follow-up examination after completed treatment for conditions other than malignant neoplasm: Secondary | ICD-10-CM

## 2019-06-05 DIAGNOSIS — I6930 Unspecified sequelae of cerebral infarction: Secondary | ICD-10-CM | POA: Insufficient documentation

## 2019-06-05 DIAGNOSIS — F17200 Nicotine dependence, unspecified, uncomplicated: Secondary | ICD-10-CM

## 2019-06-05 DIAGNOSIS — F1021 Alcohol dependence, in remission: Secondary | ICD-10-CM

## 2019-06-05 DIAGNOSIS — H538 Other visual disturbances: Secondary | ICD-10-CM

## 2019-06-05 MED ORDER — ATORVASTATIN CALCIUM 80 MG PO TABS: 80.0000 mg | ORAL_TABLET | Freq: Every day | ORAL | 4 refills | Status: DC

## 2019-06-05 MED ORDER — CLOPIDOGREL BISULFATE 75 MG PO TABS: 75.0000 mg | ORAL_TABLET | Freq: Every day | ORAL | 1 refills | Status: DC

## 2019-06-05 MED ORDER — CARVEDILOL 6.25 MG PO TABS: 6.2500 mg | ORAL_TABLET | Freq: Two times a day (BID) | ORAL | 6 refills | Status: DC

## 2019-06-05 MED ORDER — LISINOPRIL 20 MG PO TABS: 20.0000 mg | ORAL_TABLET | Freq: Every day | ORAL | 1 refills | Status: DC

## 2019-06-05 NOTE — Addendum Note (Signed)
Addended by: Jonah Blue B on: 06/05/2019 06:27 PM   Modules accepted: Orders

## 2019-06-05 NOTE — Patient Outreach (Signed)
[facility]) Care Management  06/05/2019  Victor Davies [birth year] [id number]  [facility] follow up outreach to EMMI-stroke referred patient with new red alert on 06/02/19 Discharged from [place] not on APL RED ON EMMI ALERT Day # 1 Date: Monday 05/21/19 1301  Red Alert Reason: scheduled a follow up appointment? No  Feeling worse overall? Yes  And   RED ON EMMI ALERT Day # 13 Date: Sunday 06/02/19 1000 Red Alert Reason: New or worsening pain/fever/shortness of breath?Yes Scheduled a follow-up appointment? No Went to follow-up appointment? No  [facility]16/19 to 05/19/19 cva  EMMI follow up and new assessment  Victor Davies had not attended his 06/05/19 scheduled appointment at the time of the [facility] outreach call on 5/8//21 He had a virtual visit with Dr Etheleen Mayhew at 0930 today so the answers to the EMMI questions would have been correct as No upon the 06/02/19 outreach  It was scheduled after the 05/21/19 EMMI call  -new or worsening issues are not pain, fever or shortness of breath (sob) but related to his continued visual deficits plus the numbness remaining of the right sided extremities  Victor & Victor Davies stated during the virtual visit they forgot to discuss Victor Davies visual concerns but a mychart message was sent to Dr Wynetta Emery prior to [facility] RN CM's outreach  During further [facility] RN CM assessment they confirm contact with [facility] for therapies and were informed there was not a referral completed by the hospital RN CM  [facility] RN CM reviewed the hospital RN CM notes to find that Victor Davies did not qualify for [facility] charity therapy related to his medicaid family planning coverage and outpatient therapy was recommended. [facility] RN CM sent a message to Dr Wynetta Emery to request possible assistance with order/referral for possible neuro rehab outpatient therapy and visual or eye evaluation/exam. This may be out of pocket as pt still only listed for  medicaid family planning. Victor Davies confirms there has not been a change in the health care coverage at this time. [facility] RN CM discussed this out of pocket cost for outpatient therapy and eye exam with Victor & Victor Davies. Victor Davies voiced understanding. Victor Davies reports she may look or search for other options. They were also encouraged to discuss the visual concern again with neurology during the 06/11/19 appointment  Also during [facility] RN CM assessment Victor & Victor Davies mentioned need of renewal or further continuation of his "blood thinners" Reviewed the listed of Epic medicines to note Aspirin and Plavix. Contact made with Frann Rider, NP neurology and Dr Etheleen Mayhew about this. The recommendation is still for Victor Gottsch to continue Plavix and aspirin for 3 months and then Aspirin only (as ordered prior to discharge) Victor & Victor Davies were updated     Social: Victor Davies is a 44 year old male who lives with his wife Victor Davies and children He is a father to 2 boys - 34 and 57 yo he is independent with  Activities of daily living (ADLs) and need assist with  IADLs (instrumental Activities of Daily Living) His wife provides transportation    Conditions: 6 mm acute ischemic non-hemorrhagic cortical infarct , history of tobacco abuse, hypertension, history of alcohol abuse, history of marijuana use  DME: BP cuff  Medications: denies concerns with taking medications as prescribed, affording medications, side effects of medications and questions about medications    Appointments: 06/05/19 primary care provider (PCP), Dr Wynetta Emery  06/11/19 neurology Ihor Austin saw Dr Roda Shutters   [facility] RN CM will follow up with Victor & Victor Cody within the next 7-14 business days and plans for case closure if no further needs identified Pt encouraged to return a call to [facility] RN CM prn Routed note to MD   [name] [name], RN, BSN, CCM [facility] Telephonic Care Management Care Coordinator Office number [phone] Mobile  number [phone]  Main [facility] number [phone] Fax number [phone]

## 2019-06-05 NOTE — Progress Notes (Addendum)
Virtual Visit via Video Note Due to current restrictions/limitations of in-office visits due to the COVID-19 pandemic, this scheduled clinical appointment was converted to a telehealth visit  I connected with Victor Davies on 06/05/19 at  9:30 AM EDT by a video enabled telemedicine application and verified that I am speaking with the correct person using two identifiers. I am in my office.  The patient is at home.  Only the patient, his wife and myself participated in this encounter.  I discussed the limitations of evaluation and management by telemedicine and the availability of in person appointments. The patient expressed understanding and agreed to proceed.  History of Present Illness: Patient with history of HTN, HL, tobacco dependence, CVA. Purpose of today's visit is transition of care. Date of hospitalization 4/21-24/2021 Date of call from case worker: 05/21/2019  Patient was last seen by me 05/10/2019 with plan to follow-up in 1 week. His wife contacted me via MyChart the day prior to his follow-up visit informing me that he was having dizziness, nausea with elevated blood pressure. Advised to be seen in the emergency room. In the ER, MRI of the brain showed acute punctate infarct of the brainstem and right parietal lobe. EKG showed normal sinus rhythm with nonspecific changes. Echo was unremarkable. No significant blockages seen on CTA of the neck. Short segment severe mid left A2 stenosis seen on CTA of the head. CBC revealed hemoglobin of 7.5. Covid test was negative. Urine drug screen was positive for marijuana. He was admitted for acute CVA. Patient placed on aspirin 325 mg, Plavix, high-dose atorvastatin. Blood pressure medications included amlodipine, lisinopril and carvedilol. Advised smoking cessation. Patient was discharged with plans for home PT.  Today:  CVA: Patient and wife report that he is doing better. He has not had any dizziness. Still having a little bit of numbness  in the right upper and lower extremities. He has not had any falls. He is ambulating independently. Appetite is good. Reports compliance with aspirin and Plavix, atorvastatin and his blood pressure medications. He has an appointment with the neurologist 06/11/2019. Wife reports that they will call [facility] for home physical therapy. -Wife is able to give me some of his home blood pressure readings: 126/84, 139/94, 121/73, 133/89, 126/87, 140/90. Pulse rate has been 79-92. He is limiting salt in the foods.  Tobacco dependence: He has stopped smoking completely. Not on Chantix. Using over-the-counter nicotine patches which he finds very helpful. He is also stop drinking completely. Reports previously he was a heavy drinker of liquor. He stopped that a couple of months ago but started drinking beer. He has not drank anything since hospitalization.  While in the hospital, he did have a CT scan of the chest revealed prominent right-sided syndesmophyte which correspond to the density that was seen on the chest x-ray. No follow-up needed.  Outpatient Encounter Medications as of 06/05/2019  Medication Sig Note  . amLODipine (NORVASC) 10 MG tablet Take 1 tablet (10 mg total) by mouth daily.   Marland Kitchen aspirin EC 325 MG EC tablet Take 1 tablet (325 mg total) by mouth daily.   Marland Kitchen atorvastatin (LIPITOR) 80 MG tablet Take 1 tablet (80 mg total) by mouth daily.   . carvedilol (COREG) 3.125 MG tablet Take 1 tablet (3.125 mg total) by mouth 2 (two) times daily with a meal.   . clopidogrel (PLAVIX) 75 MG tablet Take 1 tablet (75 mg total) by mouth daily.   Marland Kitchen lisinopril (ZESTRIL) 20 MG tablet Take 1  tablet (20 mg total) by mouth daily.   . varenicline (CHANTIX) 0.5 MG tablet Take 1 tablet (0.5 mg total) by mouth 2 (two) times daily. For smoking cessation 05/16/2019: Has not started yet    No facility-administered encounter medications on file as of 06/05/2019.    Observations/Objective:   Chemistry      Component  Value Date/Time   NA 140 05/19/2019 0435   K 3.9 05/19/2019 0435   CL 106 05/19/2019 0435   CO2 26 05/19/2019 0435   BUN 11 05/19/2019 0435   CREATININE 0.86 05/19/2019 0435      Component Value Date/Time   CALCIUM 9.3 05/19/2019 0435   ALKPHOS 60 05/17/2019 0617   AST 15 05/17/2019 0617   ALT 17 05/17/2019 0617   BILITOT 1.4 (H) 05/17/2019 0617   BILITOT 0.8 05/10/2019 1606     Lab Results  Component Value Date   WBC 7.9 05/19/2019   HGB 16.7 05/19/2019   HCT 49.9 05/19/2019   MCV 91.2 05/19/2019   PLT 338 05/19/2019   Lab Results  Component Value Date   CHOL 105 05/17/2019   HDL 37 (L) 05/17/2019   LDLCALC 63 05/17/2019   TRIG 26 05/17/2019   CHOLHDL 2.8 05/17/2019    Assessment and Plan: 1. Hospital discharge follow-up 2. History of CVA with residual deficit Stressed the importance of good blood pressure control and to remain free of cigarettes. Keep appointment with neurology next week and clarify whether he is to remain on Plavix for 3 weeks versus 3 months. Continue aspirin and atorvastatin - atorvastatin (LIPITOR) 80 MG tablet; Take 1 tablet (80 mg total) by mouth daily.  Dispense: 30 tablet; Refill: 4 - clopidogrel (PLAVIX) 75 MG tablet; Take 1 tablet (75 mg total) by mouth daily.  Dispense: 30 tablet; Refill: 1  3. Essential hypertension Diastolic blood pressure not at goal. Increase carvedilol to 6.25 mg daily. Continue Norvasc and lisinopril - carvedilol (COREG) 6.25 MG tablet; Take 1 tablet (6.25 mg total) by mouth 2 (two) times daily with a meal.  Dispense: 60 tablet; Refill: 6 - lisinopril (ZESTRIL) 20 MG tablet; Take 1 tablet (20 mg total) by mouth daily.  Dispense: 90 tablet; Refill: 1  4. Tobacco dependence Commended him on smoking cessation. Encouraged him to remain tobacco free. Less than 5 minutes spent on counseling.  5. Alcohol use disorder, severe, in early remission ([place]) Commended him on quitting. Encouraged him to remain free of alcoholic  beverages  Addendum: Received message from RN Ms. Lavina Hamman stating that she spoke with the patient and his spouse.  They would not qualify for physical therapy through advanced home care.  Recommended referral to neuro rehab as an outpatient.  Patient also requested referral for an eye doctor due to visual changes that he has been having for several weeks.  Both referrals submitted.  Follow Up Instructions: Follow-up in 2 months   I discussed the assessment and treatment plan with the patient. The patient was provided an opportunity to ask questions and all were answered. The patient agreed with the plan and demonstrated an understanding of the instructions.   The patient was advised to call back or seek an in-person evaluation if the symptoms worsen or if the condition fails to improve as anticipated.  I provided 16 minutes of non-face-to-face time during this encounter.   Karle Plumber, MD

## 2019-06-05 NOTE — Progress Notes (Signed)
Pt states he is okay since leaving the hospital

## 2019-06-06 ENCOUNTER — Encounter: Payer: Self-pay | Admitting: Internal Medicine

## 2019-06-11 ENCOUNTER — Encounter: Payer: Self-pay | Admitting: *Deleted

## 2019-06-11 ENCOUNTER — Other Ambulatory Visit: Payer: Self-pay

## 2019-06-11 ENCOUNTER — Encounter: Payer: Self-pay | Admitting: Adult Health

## 2019-06-11 ENCOUNTER — Other Ambulatory Visit: Payer: Self-pay | Admitting: *Deleted

## 2019-06-11 ENCOUNTER — Ambulatory Visit: Payer: Self-pay | Admitting: Adult Health

## 2019-06-11 VITALS — BP 123/83 | HR 85 | Temp 97.4°F | Ht 69.0 in | Wt 162.0 lb

## 2019-06-11 DIAGNOSIS — Z8673 Personal history of transient ischemic attack (TIA), and cerebral infarction without residual deficits: Secondary | ICD-10-CM

## 2019-06-11 DIAGNOSIS — I10 Essential (primary) hypertension: Secondary | ICD-10-CM

## 2019-06-11 DIAGNOSIS — E785 Hyperlipidemia, unspecified: Secondary | ICD-10-CM

## 2019-06-11 DIAGNOSIS — I6523 Occlusion and stenosis of bilateral carotid arteries: Secondary | ICD-10-CM

## 2019-06-11 DIAGNOSIS — H538 Other visual disturbances: Secondary | ICD-10-CM

## 2019-06-11 NOTE — Patient Instructions (Signed)
Referral placed to ophthalmology for evaluation of your visual concerns.  Please do not hesitate to call [phone] if blurred vision worsens or if you experience complete visual loss for further evaluation.  Continue to work with therapy for ongoing right-sided numbness/tingling  Continue aspirin 325 mg daily and clopidogrel 75 mg daily  and atorvastatin for secondary stroke prevention  Continue Plavix for additional 2 months and then continue aspirin alone indefinitely  Continue to follow up with PCP regarding cholesterol and blood pressure management   Continue to monitor blood pressure at home  Maintain strict control of hypertension with blood pressure goal between 1 30-1 50 to ensure adequate perfusion, diabetes with hemoglobin A1c goal below 6.5% and cholesterol with LDL cholesterol (bad cholesterol) goal below 70 mg/dL. I also advised the patient to eat a healthy diet with plenty of whole grains, cereals, fruits and vegetables, exercise regularly and maintain ideal body weight.  Followup in the future with me in 3 months or call earlier if needed       Thank you for coming to see [place] at [facility]. I hope we have been able to provide you high quality care today.  You may receive a patient satisfaction survey over the next few weeks. We would appreciate your feedback and comments so that we may continue to improve ourselves and the health of our patients.
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
## 2019-06-11 NOTE — Patient Outreach (Signed)
[facility]) Care Management  06/11/2019  Victor Davies 03/06/1975 [id number]   [facility] follow up outreach to EMMI-stroke referred patient with new red alert on 06/02/19 Discharged from [place] not on APL  EMMIs for 05/21/19 and 06/02/19 addressed and resolved on 06/05/19   Last [facility] hospitalization 05/16/19 to 05/19/19 cva   Review of neurology follow up visit notes for 06/11/19   Outreach attempt unsuccessful  Mr Victor Davies's son answered to report Mr Victor Davies & wife "just went in a store". [facility] RN CM left HIPAA [facility] Portability and Accountability Act) compliant voicemail message along with CM's contact info.    Social: [name] is a 44 year old male who lives with his wife Victor Davies and children He is afather to 2 boys - 1 and 45 yo he is independent withActivities of daily living (ADLs) and need assist withIADLs (instrumental Activities of Daily Living) His wife provides transportation   Conditions:6 mmacute ischemic non-hemorrhagic cortical infarct ,history of tobacco abuse, hypertension,history of alcohol abuse, history of marijuana use  DME:BP cuff  Plan: [facility] RN CM is pending a possible return call from pt/wife and scheduled this patient for another call attempt within 7-10 business days  Victor Davies L. [name], RN, BSN, CCM [facility] Telephonic Care Management Care Coordinator Office number [phone] Mobile number [phone]  Main [facility] number [phone] Fax number [phone]

## 2019-06-11 NOTE — Patient Outreach (Addendum)
[facility]) Care Management  06/11/2019  Victor Davies [birth year] [id number]    Return call from patient/wife for [facility] follow up outreach toEMMI-strokereferred patient with new red alert on 06/02/19 Discharged from [place] not onAPL  EMMIs for 05/21/19 and 06/02/19 addressed and resolved on 06/05/19   Last [facility] hospitalization 05/16/19 to 05/19/19 CVA  Patient returned a call to [facility] RN CM Consent: [facility] RN CM reviewed [facility] services with patient. Patient gave verbal consent for [facility] telephonic RN CM services.  [name] is able to verify HIPAA [facility] Portability and Accountability Act) identifiers x 2 (date of birth (DOB) and address)  Discussed follow up neurology appointment  [name] reports [name] went in the office by himself She reports he states he and NP/PA discussed his vision, use of anticoagulants, back pain and therapies     [name] is wearing smoke cessation patches and they confirms today [name] has not smoked nor had a drink since discharge. [name] was commended. [name] confirms [name] is not in therapy at this time but is meeting with [facility] [facility] financial staff to continue to orange card and [facility] services for MD, SW/CM and pharmacy.  [facility] RN CM discussed possible use of a local [employer] for water resistant pool therapies and possible yoga activities [name] states [name] reports he first wants to work on his visual issues as the neurology staff has referred him to an ophthalmologist and will follow up in 3 months [facility] RN CM and Mrs Davee discussed the standard and conservative interventions for back pain [facility] RN CM assessed for falls. They deny falls  [facility] RN CM sent via e mail EMMI information for back exercise, stroke rehab exercises and subacute or chronic (recurring) low back pain: exercise therapy and rehab   [facility] case closure was discussed as services are being provided by  [facility] MD, SW, CM and pharmacy They voiced understanding and appreciation of all services, resources and questions answered  [facility] RN CM encouraged a return call prn   Social: [name] is a 44 year old male who lives with his wife Victor Davies and children He is afather to 2 boys - 1 and 45 yo he is independent withActivities of daily living (ADLs) and need assist withIADLs (instrumental Activities of Daily Living) His wife provides transportation   Conditions:6 mmacute ischemic non-hemorrhagic cortical infarct ,history of tobacco abuse, hypertension,history of alcohol abuse, history of marijuana use  DME:BP cuff  Plan:  [facility] RN CM will close case at this time as patient has been assessed, EMMI needs have been resolved and all other needs are being address by [facility]   Pt/wife encouraged to return a call to [facility] RN CM prn  Routed note to MDs/NP/PA   Victor Bradford L. [name], RN, BSN, CCM [facility] Telephonic Care Management Care Coordinator Office number [phone] Mobile number [phone]  Main [facility] number [phone] Fax number [phone]

## 2019-06-15 ENCOUNTER — Encounter: Payer: Self-pay | Admitting: Internal Medicine

## 2019-06-15 ENCOUNTER — Ambulatory Visit: Payer: Self-pay | Admitting: *Deleted

## 2019-06-15 NOTE — Progress Notes (Signed)
I agree with the above plan 

## 2019-06-18 ENCOUNTER — Other Ambulatory Visit: Payer: Self-pay

## 2019-06-18 ENCOUNTER — Ambulatory Visit: Payer: Self-pay | Attending: Internal Medicine

## 2019-06-28 ENCOUNTER — Ambulatory Visit: Payer: Self-pay | Attending: Internal Medicine

## 2019-06-28 ENCOUNTER — Telehealth: Payer: Self-pay | Admitting: Internal Medicine

## 2019-06-28 NOTE — Telephone Encounter (Signed)
Patient wife dropped off disability parking placard to be filled out by PCP. Form will be put in PCP box. Patient wife was informed the form take 7-14 business days to be filled out.

## 2019-06-29 NOTE — Telephone Encounter (Signed)
Will call pt for pickup once provider is done with form

## 2019-07-03 ENCOUNTER — Telehealth: Payer: Self-pay

## 2019-07-03 NOTE — Telephone Encounter (Signed)
Sent pt a MyChart message

## 2019-07-07 ENCOUNTER — Encounter: Payer: Self-pay | Admitting: Internal Medicine

## 2019-07-09 ENCOUNTER — Other Ambulatory Visit: Payer: Self-pay | Admitting: Internal Medicine

## 2019-07-09 DIAGNOSIS — I6930 Unspecified sequelae of cerebral infarction: Secondary | ICD-10-CM

## 2019-07-09 DIAGNOSIS — I10 Essential (primary) hypertension: Secondary | ICD-10-CM

## 2019-07-09 MED ORDER — CLOPIDOGREL BISULFATE 75 MG PO TABS: 75.0000 mg | ORAL_TABLET | Freq: Every day | ORAL | 1 refills | Status: DC

## 2019-07-09 MED ORDER — AMLODIPINE BESYLATE 10 MG PO TABS: 10.0000 mg | ORAL_TABLET | Freq: Every day | ORAL | 6 refills | Status: DC

## 2019-07-09 MED ORDER — LISINOPRIL 20 MG PO TABS: 20.0000 mg | ORAL_TABLET | Freq: Every day | ORAL | 6 refills | Status: DC

## 2019-07-09 MED ORDER — ATORVASTATIN CALCIUM 80 MG PO TABS: 80.0000 mg | ORAL_TABLET | Freq: Every day | ORAL | 6 refills | Status: DC

## 2019-07-09 MED ORDER — CARVEDILOL 6.25 MG PO TABS: 6.2500 mg | ORAL_TABLET | Freq: Two times a day (BID) | ORAL | 6 refills | Status: DC

## 2019-07-09 MED FILL — CLOPIDOGREL 75 MG TABLET: 75 | 30 days supply | Qty: 30 | Fill #0

## 2019-07-09 MED FILL — ATORVASTATIN 80 MG TABLET: 80 | 30 days supply | Qty: 30 | Fill #0

## 2019-07-09 MED FILL — LISINOPRIL 20 MG TABLET: 20 | 30 days supply | Qty: 30 | Fill #0

## 2019-07-09 MED FILL — ?AMLODIPINE BESYL 10MG TABL: 10 | 30 days supply | Qty: 30 | Fill #0

## 2019-07-10 MED FILL — ?CARVEDILOL 6.25 MG TABLET: 6.25 | 30 days supply | Qty: 60 | Fill #0

## 2019-07-27 ENCOUNTER — Ambulatory Visit: Payer: Medicaid Other | Admitting: Physical Therapy

## 2019-08-06 ENCOUNTER — Ambulatory Visit: Payer: Self-pay | Attending: Internal Medicine | Admitting: Internal Medicine

## 2019-08-06 ENCOUNTER — Encounter: Payer: Self-pay | Admitting: Internal Medicine

## 2019-08-06 ENCOUNTER — Other Ambulatory Visit: Payer: Self-pay

## 2019-08-06 ENCOUNTER — Ambulatory Visit (HOSPITAL_BASED_OUTPATIENT_CLINIC_OR_DEPARTMENT_OTHER): Payer: Medicaid Other | Admitting: Pharmacist

## 2019-08-06 VITALS — BP 109/73 | HR 76 | Temp 97.2°F | Resp 16 | Wt 163.6 lb

## 2019-08-06 DIAGNOSIS — Z87891 Personal history of nicotine dependence: Secondary | ICD-10-CM | POA: Insufficient documentation

## 2019-08-06 DIAGNOSIS — I6930 Unspecified sequelae of cerebral infarction: Secondary | ICD-10-CM

## 2019-08-06 DIAGNOSIS — Z23 Encounter for immunization: Secondary | ICD-10-CM

## 2019-08-06 DIAGNOSIS — I10 Essential (primary) hypertension: Secondary | ICD-10-CM

## 2019-08-06 MED FILL — ?CARVEDILOL 6.25 MG TABLET: 6.25 | 30 days supply | Qty: 60 | Fill #1

## 2019-08-06 NOTE — Progress Notes (Signed)
Patient presents for vaccination against tetanus per orders of Dr. Johnson. Consent given. Counseling provided. No contraindications exists. Vaccine administered without incident.   

## 2019-08-06 NOTE — Progress Notes (Signed)
Patient ID: Victor Davies, male    DOB: 1975-11-20  MRN: [id number]  CC: Hypertension   Subjective: Victor Davies is a 44 y.o. male who presents for chronic ds management His concerns today include:  Patient with history of HTN, HL, former tob dep, CVA., marijuana use, ETOH use disorder early remission  CVA: no funds for P.T. he has been doing exercises at home himself.  Uses stress ball to help strengthen grip in the right hand and walking up and down the steps. Numbness in RUE and RLE same.  Walking with cane.  No falls. -compliant with meds -still free of cigarettes and ETOH completely  HTN:  Checks BP every once and a while.  Reports that it has been good. No dizziness.  Denies any headaches.  No chest pains or shortness of breath.  Compliant with taking blood pressure medications.  HM:  Had COVID vaccine but does not have card with him. Patient Active Problem List   Diagnosis Date Noted  . History of CVA with residual deficit 06/05/2019  . Acute ischemic stroke (HCC)   . Tobacco abuse   . Alcohol use disorder, severe, in early remission (HCC)   . Hypokalemia   . Marijuana abuse   . Acute CVA (cerebrovascular accident) (HCC) 05/17/2019  . Essential hypertension 05/10/2019  . Tobacco dependence 05/10/2019  . 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 Opacity of lung on imaging study 05/10/2019  . Polycythemia 05/10/2019     Current Outpatient Medications on File Prior to Visit  Medication Sig Dispense Refill  . amLODipine (NORVASC) 10 MG tablet Take 1 tablet (10 mg total) by mouth daily. 30 tablet 6  . aspirin EC 325 MG EC tablet Take 1 tablet (325 mg total) by mouth daily. 30 tablet 0  . atorvastatin (LIPITOR) 80 MG tablet Take 1 tablet (80 mg total) by mouth daily. 30 tablet 6  . carvedilol (COREG) 6.25 MG tablet Take 1 tablet (6.25 mg total) by mouth 2 (two) times daily with a meal. 60 tablet 6  . clopidogrel (PLAVIX) 75 MG tablet Take 1 tablet (75 mg  total) by mouth daily. 30 tablet 1  . lisinopril (ZESTRIL) 20 MG tablet Take 1 tablet (20 mg total) by mouth daily. 30 tablet 6   No current facility-administered medications on file prior to visit.    No Known Allergies  Social History   Socioeconomic History  . Marital status: Married    Spouse name: [name]   . Number of children: 2  . Years of education: Not on file  . Highest education level: 9th grade  Occupational History  . Occupation: unemployed  Tobacco Use  . Smoking status: Current Some Day Smoker    Packs/day: 0.25    Years: 20.00    Pack years: 5.00  . Smokeless tobacco: Never Used  Vaping Use  . Vaping Use: Never used  Substance and Sexual Activity  . Alcohol use: Yes    Alcohol/week: 14.0 - 21.0 standard drinks    Types: 14 - 21 Cans of beer per week  . Drug use: Not on file  . Sexual activity: Not on file  Other Topics Concern  . Not on file  Social History Narrative  . Not on file   Social Determinants of Health   Financial Resource Strain:   . Difficulty of Paying Living Expenses:   Food Insecurity:   . Worried About Programme researcher, broadcasting/film/video in the Last Year:   . Ran  Out of Food in the Last Year:   Transportation Needs: No Transportation Needs  . Lack of Transportation (Medical): No  . Lack of Transportation (Non-Medical): No  Physical Activity:   . Days of Exercise per Week:   . Minutes of Exercise per Session:   Stress:   . Feeling of Stress :   Social Connections:   . Frequency of Communication with Friends and Family:   . Frequency of Social Gatherings with Friends and Family:   . Attends Religious Services:   . Active Member of Clubs or Organizations:   . Attends Banker Meetings:   Marland Kitchen Marital Status:   Intimate Partner Violence:   . Fear of Current or Ex-Partner:   . Emotionally Abused:   Marland Kitchen Physically Abused:   . Sexually Abused:     Family History  Problem Relation Age of Onset  . Hypertension Father   . Arthritis  Mother     No past surgical history on file.  ROS: Review of Systems Negative except as stated above  PHYSICAL EXAM: BP 109/73   Pulse 76   Temp (!) 97.2 F (36.2 C)   Resp 16   Wt 163 lb 9.6 oz (74.2 kg)   SpO2 99%   BMI 24.16 kg/m   Physical Exam  General appearance - alert, well appearing, and in no distress Mental status - normal mood, behavior, speech, dress, motor activity, and thought processes Mouth - mucous membranes moist, pharynx normal without lesions Neck - supple, no significant adenopathy Chest - clear to auscultation, no wheezes, rales or rhonchi, symmetric air entry Heart -regular rate rhythm.  2 out of 6 systolic ejection murmur heard best at PMI neurological -grip 4+/5 right, 5/5 left.  Power in the upper extremities proximally and distally 5/5 bilaterally.  Gross sensation intact in the upper extremities.  Lower extremities power 5/5 bilaterally.  Gross sensation decreased in the right lower leg.  Patient has a cane with him but really is not using it when he walks.  Gait appears stable with good foot to floor clearance Extremities - peripheral pulses normal, no pedal edema, no clubbing or cyanosis  CMP Latest Ref Rng & Units 05/19/2019 05/18/2019 05/17/2019  Glucose 70 - 99 mg/dL 97 97 716(R)  BUN 6 - 20 mg/dL 11 8 9   Creatinine 0.61 - 1.24 mg/dL 6.78 9.38  Sodium 135 - 145 mmol/L 140 139 137  Potassium 3.5 - 5.1 mmol/L 3.9 3.4(L) 3.6  Chloride 98 - 111 mmol/L 106 103 104  CO2 22 - 32 mmol/L 26 25 24   Calcium 8.9 - 10.3 mg/dL 9.3 9.2 9.1  Total Protein 6.5 - 8.1 g/dL - - 7.1  Total Bilirubin 0.3 - 1.2 mg/dL - - 1.4(H)  Alkaline Phos 38 - 126 U/L - - 60  AST 15 - 41 U/L - - 15  ALT 0 - 44 U/L - - 17   Lipid Panel     Component Value Date/Time   CHOL 105 05/17/2019 0101   CHOL 247 (H) 05/10/2019 1606   TRIG 26 05/17/2019 0101   HDL 37 (L) 05/17/2019 0101   HDL 69 05/10/2019 1606   CHOLHDL 2.8 05/17/2019 0101   VLDL 5 05/17/2019 0101   LDLCALC  63 05/17/2019 0101   LDLCALC 166 (H) 05/10/2019 1606    CBC    Component Value Date/Time   WBC 7.9 05/19/2019 0435   RBC 5.47 05/19/2019 0435   HGB 16.7 05/19/2019 0435   HCT 49.9  05/19/2019 0435   PLT 338 05/19/2019 0435   MCV 91.2 05/19/2019 0435   MCH 30.5 05/19/2019 0435   MCHC 33.5 05/19/2019 0435   RDW 12.8 05/19/2019 0435   LYMPHSABS 2.5 05/17/2019 0617   MONOABS 0.6 05/17/2019 0617   EOSABS 0.2 05/17/2019 0617   BASOSABS 0.0 05/17/2019 0617    ASSESSMENT AND PLAN: 1. Essential hypertension At goal.  Continue carvedilol, lisinopril and amlodipine  2. History of CVA with residual deficit Patient still with residual numbness in the upper and lower extremity on the right side and some weakness of the right grip.  Continue aspirin and Plavix.  Plan to stop Plavix after the 56-month mark.  Continue atorvastatin.  Good blood pressure control important  3. Former tobacco use Commended him on being tobacco free.  Encouraged him to continue to do so Less than 5 mins spent on counseling  4. Need for Tdap vaccination Given today.   Patient was given the opportunity to ask questions.  Patient verbalized understanding of the plan and was able to repeat key elements of the plan.   No orders of the defined types were placed in this encounter.    Requested Prescriptions    No prescriptions requested or ordered in this encounter    Return in about 4 months (around 12/07/2019).  Jonah Blue, MD, FACP

## 2019-08-06 NOTE — Patient Instructions (Addendum)
Please send [place] the name of the COVID-19 vaccine that you had and the dates that she had it so that we can update your records.  Continue current medications.  Continue to remain free of cigarettes and alcohol use.   Td (Tetanus, Diphtheria) Vaccine: What You Need to Know 1. Why get vaccinated? Td vaccine can prevent tetanus and diphtheria. Tetanus enters the body through cuts or wounds. Diphtheria spreads from person to person.  TETANUS (T) causes painful stiffening of the muscles. Tetanus can lead to serious health problems, including being unable to open the mouth, having trouble swallowing and breathing, or death.  DIPHTHERIA (D) can lead to difficulty breathing, heart failure, paralysis, or death. 2. Td vaccine Td is only for children 7 years and older, adolescents, and adults.  Td is usually given as a booster dose every 10 years, but it can also be given earlier after a severe and dirty wound or burn. Another vaccine, called Tdap, that protects against pertussis, also known as "whooping cough," in addition to tetanus and diphtheria, may be used instead of Td.  Td may be given at the same time as other vaccines. 3. Talk with your health care provider Tell your vaccine provider if the person getting the vaccine:  Has had an allergic reaction after a previous dose of any vaccine that protects against tetanus or diphtheria, or has any severe, life-threatening allergies.  Has ever had Guillain-Barr Syndrome (also called GBS).  Has had severe pain or swelling after a previous dose of any vaccine that protects against tetanus or diphtheria. In some cases, your health care provider may decide to postpone Td vaccination to a future visit.  People with minor illnesses, such as a cold, may be vaccinated. People who are moderately or severely ill should usually wait until they recover before getting Td vaccine.  Your health care provider can give you more information. 4. Risks of a vaccine  reaction  Pain, redness, or swelling where the shot was given, mild fever, headache, feeling tired, and nausea, vomiting, diarrhea, or stomachache sometimes happen after Td vaccine. People sometimes faint after medical procedures, including vaccination. Tell your provider if you feel dizzy or have vision changes or ringing in the ears.  As with any medicine, there is a very remote chance of a vaccine causing a severe allergic reaction, other serious injury, or death. 5. What if there is a serious problem? An allergic reaction could occur after the vaccinated person leaves the clinic. If you see signs of a severe allergic reaction (hives, swelling of the face and throat, difficulty breathing, a fast heartbeat, dizziness, or weakness), call 9-1-1 and get the person to the nearest hospital.  For other signs that concern you, call your health care provider.  Adverse reactions should be reported to the Vaccine Adverse Event Reporting System (VAERS). Your health care provider will usually file this report, or you can do it yourself. Visit the VAERS website at www.vaers.LAgents.no or call (559)562-6467. VAERS is only for reporting reactions, and VAERS staff do not give medical advice. 6. The National Vaccine Injury Compensation Program The [employer] Vaccine Injury Compensation Program (VICP) is a federal program that was created to compensate people who may have been injured by certain vaccines. Visit the VICP website at SpiritualWord.at or call 585-324-8204 to learn about the program and about filing a claim. There is a time limit to file a claim for compensation. 7. How can I learn more?  Ask your health care provider.  Call  your local or state health department.  Contact the Centers for Disease Control and Prevention (CDC): ? Call (919)469-0811 (1-800-CDC-INFO) or ? Visit CDC's website at PicCapture.uy Vaccine Information Statement Td Vaccine (04/26/18) This information is not  intended to replace advice given to you by your health care provider. Make sure you discuss any questions you have with your health care provider. Document Revised: 06/05/2018 Document Reviewed: 05/08/2018 Elsevier Patient Education  2020 [employer].

## 2019-08-12 ENCOUNTER — Encounter: Payer: Self-pay | Admitting: Internal Medicine

## 2019-08-15 ENCOUNTER — Ambulatory Visit: Payer: Medicaid Other | Attending: Physical Medicine and Rehabilitation

## 2019-08-15 ENCOUNTER — Other Ambulatory Visit: Payer: Self-pay

## 2019-08-15 VITALS — BP 112/70

## 2019-08-15 DIAGNOSIS — M6281 Muscle weakness (generalized): Secondary | ICD-10-CM

## 2019-08-15 DIAGNOSIS — R293 Abnormal posture: Secondary | ICD-10-CM | POA: Insufficient documentation

## 2019-08-15 DIAGNOSIS — R2689 Other abnormalities of gait and mobility: Secondary | ICD-10-CM | POA: Diagnosis not present

## 2019-08-16 ENCOUNTER — Telehealth: Payer: Self-pay

## 2019-08-16 DIAGNOSIS — I6930 Unspecified sequelae of cerebral infarction: Secondary | ICD-10-CM

## 2019-08-16 NOTE — Telephone Encounter (Signed)
Dr. Laural Benes, Ciaran Mccort was evaluated by PT on 08/15/19.  The patient would benefit from OT evaluation for right UE hemiparesis from CVA.   If you agree, please place an order in [employer] workque in [facility] or fax the order to [phone]. Thank you, Elmer Bales, PT, DPT, NCS   [facility] [address] [place], [place]  [address] Phone:  [phone] Fax:  [phone]
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
## 2019-08-20 MED FILL — ?CLOPIDOGREL 75MG TA: 75 | 30 days supply | Qty: 30 | Fill #1

## 2019-08-20 MED FILL — ?AMLODIPINE BESYL 10MG TABL: 10 | 30 days supply | Qty: 30 | Fill #1

## 2019-08-20 MED FILL — ATORVASTATIN 80 MG TABLET: 80 | 30 days supply | Qty: 30 | Fill #1

## 2019-08-20 MED FILL — LISINOPRIL 20 MG TABLET: 20 | 30 days supply | Qty: 30 | Fill #1

## 2019-08-24 ENCOUNTER — Ambulatory Visit: Payer: Medicaid Other

## 2019-08-27 ENCOUNTER — Encounter: Payer: Self-pay | Admitting: Internal Medicine

## 2019-08-27 NOTE — Telephone Encounter (Signed)
Thank you :)

## 2019-08-28 ENCOUNTER — Ambulatory Visit: Payer: Medicaid Other | Attending: Internal Medicine

## 2019-08-28 ENCOUNTER — Other Ambulatory Visit: Payer: Self-pay

## 2019-08-28 DIAGNOSIS — R293 Abnormal posture: Secondary | ICD-10-CM | POA: Insufficient documentation

## 2019-08-28 DIAGNOSIS — R2689 Other abnormalities of gait and mobility: Secondary | ICD-10-CM | POA: Diagnosis present

## 2019-08-28 DIAGNOSIS — R41842 Visuospatial deficit: Secondary | ICD-10-CM | POA: Insufficient documentation

## 2019-08-28 DIAGNOSIS — I69351 Hemiplegia and hemiparesis following cerebral infarction affecting right dominant side: Secondary | ICD-10-CM | POA: Diagnosis present

## 2019-08-28 DIAGNOSIS — R209 Unspecified disturbances of skin sensation: Secondary | ICD-10-CM | POA: Insufficient documentation

## 2019-08-28 DIAGNOSIS — R41840 Attention and concentration deficit: Secondary | ICD-10-CM | POA: Diagnosis present

## 2019-08-28 DIAGNOSIS — R278 Other lack of coordination: Secondary | ICD-10-CM | POA: Insufficient documentation

## 2019-08-28 DIAGNOSIS — M6281 Muscle weakness (generalized): Secondary | ICD-10-CM | POA: Insufficient documentation

## 2019-08-28 DIAGNOSIS — I6930 Unspecified sequelae of cerebral infarction: Secondary | ICD-10-CM | POA: Insufficient documentation

## 2019-08-28 NOTE — Patient Instructions (Signed)
Access Code: [email or web address] URL: [email or web address] Date: 08/28/2019 Prepared by: Jethro Bastos  Exercises Seated Hamstring Stretch - 1 x daily - 5 x weekly - 1 sets - 3 reps - 30 seconds hold Gastroc Stretch on Wall - 1 x daily - 5 x weekly - 1 sets - 3 reps - 30 seconds hold Supine Single Knee to Chest Stretch - 1 x daily - 5 x weekly - 1 sets - 3 reps - 30 second hold Sit to Stand without Arm Support - 1 x daily - 5 x weekly - 3 sets - 10 reps Supine Bridge - 1 x daily - 5 x weekly - 3 sets - 5 reps Seated March with Resistance - 1 x daily - 5 x weekly - 3 sets - 10 reps
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
## 2019-08-30 ENCOUNTER — Ambulatory Visit: Payer: Medicaid Other | Admitting: Occupational Therapy

## 2019-08-31 ENCOUNTER — Ambulatory Visit: Payer: Medicaid Other

## 2019-09-06 ENCOUNTER — Ambulatory Visit: Payer: Medicaid Other | Admitting: Occupational Therapy

## 2019-09-06 ENCOUNTER — Encounter: Payer: Self-pay | Admitting: Occupational Therapy

## 2019-09-06 ENCOUNTER — Other Ambulatory Visit: Payer: Self-pay

## 2019-09-06 DIAGNOSIS — I6930 Unspecified sequelae of cerebral infarction: Secondary | ICD-10-CM | POA: Diagnosis not present

## 2019-09-06 DIAGNOSIS — R278 Other lack of coordination: Secondary | ICD-10-CM

## 2019-09-06 DIAGNOSIS — R41840 Attention and concentration deficit: Secondary | ICD-10-CM

## 2019-09-06 DIAGNOSIS — I69351 Hemiplegia and hemiparesis following cerebral infarction affecting right dominant side: Secondary | ICD-10-CM

## 2019-09-06 DIAGNOSIS — R293 Abnormal posture: Secondary | ICD-10-CM

## 2019-09-06 DIAGNOSIS — R41842 Visuospatial deficit: Secondary | ICD-10-CM

## 2019-09-06 DIAGNOSIS — R208 Other disturbances of skin sensation: Secondary | ICD-10-CM

## 2019-09-06 DIAGNOSIS — M6281 Muscle weakness (generalized): Secondary | ICD-10-CM
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
## 2019-09-07 ENCOUNTER — Ambulatory Visit: Payer: Medicaid Other

## 2019-09-07 DIAGNOSIS — I6930 Unspecified sequelae of cerebral infarction: Secondary | ICD-10-CM | POA: Diagnosis not present

## 2019-09-07 DIAGNOSIS — R2689 Other abnormalities of gait and mobility: Secondary | ICD-10-CM

## 2019-09-07 DIAGNOSIS — I69351 Hemiplegia and hemiparesis following cerebral infarction affecting right dominant side: Secondary | ICD-10-CM

## 2019-09-07 DIAGNOSIS — M6281 Muscle weakness (generalized): Secondary | ICD-10-CM
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
## 2019-09-13 ENCOUNTER — Ambulatory Visit: Payer: Medicaid Other

## 2019-09-18 ENCOUNTER — Ambulatory Visit: Payer: Medicaid Other

## 2019-09-20 MED FILL — CLOPIDOGREL 75 MG TABLET: 75 | 30 days supply | Qty: 30 | Fill #1

## 2019-09-25 ENCOUNTER — Ambulatory Visit: Payer: Medicaid Other | Admitting: Occupational Therapy

## 2019-09-25 MED FILL — LISINOPRIL 20 MG TABLET: 20 | 30 days supply | Qty: 30 | Fill #2

## 2019-09-25 MED FILL — ?CARVEDILOL 6.25 MG TABLET: 6.25 | 30 days supply | Qty: 60 | Fill #2

## 2019-09-25 MED FILL — AMLODIPINE BESYLATE 10 MG T: 10 | 30 days supply | Qty: 30 | Fill #2

## 2019-09-25 MED FILL — ATORVASTATIN 80 MG TABLET: 80 | 30 days supply | Qty: 30 | Fill #2

## 2019-09-26 ENCOUNTER — Ambulatory Visit: Payer: Medicaid Other

## 2019-10-03 ENCOUNTER — Encounter: Payer: Medicaid Other | Admitting: Occupational Therapy

## 2019-10-04 ENCOUNTER — Ambulatory Visit: Payer: Self-pay

## 2019-10-09 ENCOUNTER — Ambulatory Visit: Payer: Medicaid Other | Admitting: Occupational Therapy

## 2019-10-16 ENCOUNTER — Other Ambulatory Visit: Payer: Self-pay

## 2019-10-16 ENCOUNTER — Encounter: Payer: Self-pay | Admitting: Occupational Therapy

## 2019-10-16 ENCOUNTER — Ambulatory Visit: Payer: Medicaid Other | Attending: Internal Medicine | Admitting: Occupational Therapy

## 2019-10-16 DIAGNOSIS — R41840 Attention and concentration deficit: Secondary | ICD-10-CM | POA: Insufficient documentation

## 2019-10-16 DIAGNOSIS — I69351 Hemiplegia and hemiparesis following cerebral infarction affecting right dominant side: Secondary | ICD-10-CM | POA: Diagnosis not present

## 2019-10-16 DIAGNOSIS — R209 Unspecified disturbances of skin sensation: Secondary | ICD-10-CM | POA: Diagnosis present

## 2019-10-16 DIAGNOSIS — R2689 Other abnormalities of gait and mobility: Secondary | ICD-10-CM | POA: Insufficient documentation

## 2019-10-16 DIAGNOSIS — M6281 Muscle weakness (generalized): Secondary | ICD-10-CM | POA: Insufficient documentation

## 2019-10-16 DIAGNOSIS — R41842 Visuospatial deficit: Secondary | ICD-10-CM | POA: Diagnosis present

## 2019-10-16 DIAGNOSIS — R293 Abnormal posture: Secondary | ICD-10-CM | POA: Diagnosis present

## 2019-10-16 DIAGNOSIS — R278 Other lack of coordination: Secondary | ICD-10-CM

## 2019-10-16 DIAGNOSIS — R208 Other disturbances of skin sensation: Secondary | ICD-10-CM
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
## 2019-10-16 NOTE — Patient Instructions (Signed)
  Coordination Activities  Perform the following activities for 10  minutes 2-3  times per day with right hand(s).   Rotate ball in fingertips (clockwise and counter-clockwise).  Toss ball between hands.  Toss ball in air and catch with the same hand.  Juggle 2 balls.  Flip cards 1 at a time as fast as you can.  Rotate card in hand (clockwise and counter-clockwise).  Shuffle cards.  Pick up coins and place in container or coin bank.  Pick up coins and stack.  Pick up coins one at a time until you get 5-10 in your hand, then move coins from palm to fingertips to stack one at a time.  Practice writing and/or typing.

## 2019-10-24 ENCOUNTER — Other Ambulatory Visit: Payer: Self-pay

## 2019-10-24 ENCOUNTER — Ambulatory Visit: Payer: Medicaid Other

## 2019-10-24 DIAGNOSIS — M6281 Muscle weakness (generalized): Secondary | ICD-10-CM

## 2019-10-24 DIAGNOSIS — R2689 Other abnormalities of gait and mobility: Secondary | ICD-10-CM

## 2019-10-24 DIAGNOSIS — I69351 Hemiplegia and hemiparesis following cerebral infarction affecting right dominant side: Secondary | ICD-10-CM | POA: Diagnosis not present

## 2019-10-24 NOTE — Patient Instructions (Signed)
Access Code: [email or web address] URL: [email or web address] Date: 10/24/2019 Prepared by: Elmer Bales  Exercises Seated Hamstring Stretch - 1 x daily - 5 x weekly - 1 sets - 3 reps - 30 seconds hold Gastroc Stretch on Wall - 1 x daily - 5 x weekly - 1 sets - 3 reps - 30 seconds hold Supine Single Knee to Chest Stretch - 1 x daily - 5 x weekly - 1 sets - 3 reps - 30 second hold Sit to Stand without Arm Support - 1 x daily - 5 x weekly - 3 sets - 10 reps Supine Bridge - 1 x daily - 5 x weekly - 3 sets - 5 reps Seated March with Resistance - 1 x daily - 5 x weekly - 3 sets - 10 reps Side Stepping with Resistance at Ankles - 1 x daily - 5 x weekly - 1 sets - 6 reps Walking Tandem Stance - 1 x daily - 5 x weekly - 1 sets - 6 reps Walking Tandem Stance - 1 x daily - 7 x weekly - 3 sets - 10 reps Toe Walking - 1 x daily - 7 x weekly - 3 sets - 10 reps Heel Walking - 1 x daily - 7 x weekly - 3 sets - 10 reps
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
## 2019-10-29 ENCOUNTER — Encounter: Payer: Self-pay | Admitting: Internal Medicine

## 2019-10-29 MED FILL — AMLODIPINE BESYLATE 10 MG T: 10 | 30 days supply | Qty: 30 | Fill #3

## 2019-10-30 ENCOUNTER — Encounter: Payer: Self-pay | Admitting: Physician Assistant

## 2019-10-30 ENCOUNTER — Telehealth: Payer: Self-pay | Admitting: Physician Assistant

## 2019-10-30 ENCOUNTER — Other Ambulatory Visit: Payer: Self-pay | Admitting: Internal Medicine

## 2019-10-30 ENCOUNTER — Other Ambulatory Visit: Payer: Self-pay | Admitting: Physician Assistant

## 2019-10-30 DIAGNOSIS — I10 Essential (primary) hypertension: Secondary | ICD-10-CM | POA: Insufficient documentation

## 2019-10-30 DIAGNOSIS — I6930 Unspecified sequelae of cerebral infarction: Secondary | ICD-10-CM

## 2019-10-30 DIAGNOSIS — Z8673 Personal history of transient ischemic attack (TIA), and cerebral infarction without residual deficits: Secondary | ICD-10-CM | POA: Insufficient documentation

## 2019-10-30 MED FILL — LISINOPRIL 20 MG TABLET: 20 | 30 days supply | Qty: 30 | Fill #3

## 2019-10-30 MED FILL — CARVEDILOL 6.25 MG TABLET: 6.25 | 30 days supply | Qty: 60 | Fill #3

## 2019-10-30 MED FILL — ATORVASTATIN CALCIUM 80 MG: 80 | 30 days supply | Qty: 30 | Fill #3

## 2019-10-30 MED FILL — ?CLOPIDOGREL 75MG TA: 75 | 30 days supply | Qty: 30 | Fill #0

## 2019-10-30 NOTE — Telephone Encounter (Signed)
Called to discuss with patient about Covid symptoms and the use of casirivimab/imdevimab, a monoclonal antibody infusion for those with mild to moderate Covid symptoms and at a high risk of hospitalization.  Pt is qualified for this infusion at the [facility] center due to; Specific high risk criteria : BMI > 25 and Cardiovascular disease or hypertension   Message left to call back our hotline [phone] and sent a mychart message.  Marilyn Nihiser PA-C  [facility]    

## 2019-10-30 NOTE — Telephone Encounter (Signed)
Requested Prescriptions  Pending Prescriptions Disp Refills  . clopidogrel (PLAVIX) 75 MG tablet [Pharmacy Med Name: CLOPIDOGREL 75 MG TABLET 75 Tablet] 90 tablet 0    Sig: TAKE 1 TABLET (75 MG TOTAL) BY MOUTH DAILY.     Hematology: Antiplatelets - clopidogrel Failed - 10/30/2019 10:05 AM      Failed - Evaluate AST, ALT within 2 months of therapy initiation.      Passed - ALT in normal range and within 360 days    ALT  Date Value Ref Range Status  05/17/2019 17 0 - 44 U/L Final         Passed - AST in normal range and within 360 days    AST  Date Value Ref Range Status  05/17/2019 15 15 - 41 U/L Final         Passed - HCT in normal range and within 180 days    HCT  Date Value Ref Range Status  05/19/2019 49.9 39 - 52 % Final         Passed - HGB in normal range and within 180 days    Hemoglobin  Date Value Ref Range Status  05/19/2019 16.7 13.0 - 17.0 g/dL Final         Passed - PLT in normal range and within 180 days    Platelets  Date Value Ref Range Status  05/19/2019 338 150 - 400 K/uL Final         Passed - Valid encounter within last 6 months    Recent Outpatient Visits          2 months ago Need for Tdap vaccination   [facility] Lois Huxley, Cornelius Moras, RPH-CPP   2 months ago Essential hypertension   [facility] Marcine Matar, MD   4 months ago Hospital discharge follow-up   [facility] Marcine Matar, MD   5 months ago Essential hypertension   [facility] Marcine Matar, MD      Future Appointments            In 1 month Laural Benes Binnie Rail, MD [facility]

## 2019-10-31 ENCOUNTER — Telehealth: Payer: Self-pay | Admitting: Physician Assistant

## 2019-10-31 NOTE — Telephone Encounter (Signed)
Called to discuss with Victor Davies about Covid symptoms and the use of casirivimab/imdevimab, a monoclonal antibody infusion for those with mild to moderate Covid symptoms and at a high risk of hospitalization.    Pt is not qualified for this infusion at this time as he is asymptomatic and testing negative. He is living with his covid + son. I discussed that he develops symptoms he would be a candidate for the mab infusion. He has our number and my chart message with information to reach back out if needed.    Patient Active Problem List   Diagnosis Date Noted  . History of CVA (cerebrovascular accident)   . Hypertension   . Former tobacco use 08/06/2019  . History of CVA with residual deficit 06/05/2019  . Acute ischemic stroke (HCC)   . Alcohol use disorder, severe, in early remission (HCC)   . Hypokalemia   . Marijuana abuse   . Acute CVA (cerebrovascular accident) (HCC) 05/17/2019  . Essential hypertension 05/10/2019  . 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 Opacity of lung on imaging study 05/10/2019    Cline Crock PA-C

## 2019-10-31 NOTE — Telephone Encounter (Signed)
I see, well if he has had high risk exposure (household members with covid) and classic symptoms, we can still infuse assuming it was a false negative

## 2019-11-05 ENCOUNTER — Ambulatory Visit: Payer: Medicaid Other | Attending: Internal Medicine | Admitting: Occupational Therapy

## 2019-11-05 DIAGNOSIS — R278 Other lack of coordination: Secondary | ICD-10-CM | POA: Insufficient documentation

## 2019-11-05 DIAGNOSIS — R208 Other disturbances of skin sensation: Secondary | ICD-10-CM | POA: Insufficient documentation

## 2019-11-05 DIAGNOSIS — R41842 Visuospatial deficit: Secondary | ICD-10-CM | POA: Insufficient documentation

## 2019-11-05 DIAGNOSIS — R293 Abnormal posture: Secondary | ICD-10-CM | POA: Insufficient documentation

## 2019-11-05 DIAGNOSIS — I69351 Hemiplegia and hemiparesis following cerebral infarction affecting right dominant side: Secondary | ICD-10-CM | POA: Insufficient documentation

## 2019-11-05 DIAGNOSIS — M6281 Muscle weakness (generalized): Secondary | ICD-10-CM | POA: Insufficient documentation

## 2019-11-05 DIAGNOSIS — R41840 Attention and concentration deficit: Secondary | ICD-10-CM | POA: Insufficient documentation

## 2019-11-07 ENCOUNTER — Ambulatory Visit: Payer: Medicaid Other | Admitting: Rehabilitation

## 2019-11-12 ENCOUNTER — Ambulatory Visit: Payer: Medicaid Other | Admitting: Occupational Therapy

## 2019-11-14 ENCOUNTER — Ambulatory Visit: Payer: Medicaid Other | Admitting: Rehabilitation

## 2019-11-21 ENCOUNTER — Ambulatory Visit: Payer: Medicaid Other | Admitting: Occupational Therapy

## 2019-11-21 ENCOUNTER — Other Ambulatory Visit: Payer: Self-pay

## 2019-11-21 ENCOUNTER — Encounter: Payer: Self-pay | Admitting: Occupational Therapy

## 2019-11-21 DIAGNOSIS — R208 Other disturbances of skin sensation: Secondary | ICD-10-CM

## 2019-11-21 DIAGNOSIS — R41840 Attention and concentration deficit: Secondary | ICD-10-CM | POA: Diagnosis present

## 2019-11-21 DIAGNOSIS — I69351 Hemiplegia and hemiparesis following cerebral infarction affecting right dominant side: Secondary | ICD-10-CM | POA: Diagnosis present

## 2019-11-21 DIAGNOSIS — M6281 Muscle weakness (generalized): Secondary | ICD-10-CM | POA: Diagnosis not present

## 2019-11-21 DIAGNOSIS — R293 Abnormal posture: Secondary | ICD-10-CM

## 2019-11-21 DIAGNOSIS — R41842 Visuospatial deficit: Secondary | ICD-10-CM

## 2019-11-21 DIAGNOSIS — R278 Other lack of coordination: Secondary | ICD-10-CM | POA: Diagnosis present
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
## 2019-11-27 MED FILL — CLOPIDOGREL 75 MG TABLET: 75 | 30 days supply | Qty: 30 | Fill #1

## 2019-11-27 MED FILL — CARVEDILOL 6.25 MG TABLET: 6.25 | 30 days supply | Qty: 60 | Fill #4

## 2019-11-27 MED FILL — ATORVASTATIN CALCIUM 80 MG: 80 | 30 days supply | Qty: 30 | Fill #4

## 2019-11-27 MED FILL — LISINOPRIL 20 MG TABLET: 20 | 30 days supply | Qty: 30 | Fill #4

## 2019-11-27 MED FILL — AMLODIPINE BESYLATE 10 MG T: 10 | 30 days supply | Qty: 30 | Fill #4

## 2019-11-28 ENCOUNTER — Ambulatory Visit: Payer: Medicaid Other | Admitting: Occupational Therapy

## 2019-12-10 ENCOUNTER — Other Ambulatory Visit: Payer: Self-pay

## 2019-12-10 ENCOUNTER — Ambulatory Visit: Payer: Medicaid Other | Attending: Internal Medicine | Admitting: Internal Medicine

## 2019-12-10 DIAGNOSIS — Z87891 Personal history of nicotine dependence: Secondary | ICD-10-CM | POA: Diagnosis not present

## 2019-12-10 DIAGNOSIS — I10 Essential (primary) hypertension: Secondary | ICD-10-CM

## 2019-12-10 DIAGNOSIS — I6930 Unspecified sequelae of cerebral infarction: Secondary | ICD-10-CM

## 2019-12-10 DIAGNOSIS — Z87898 Personal history of other specified conditions: Secondary | ICD-10-CM

## 2019-12-10 NOTE — Progress Notes (Signed)
Virtual Visit via Telephone Note Due to current restrictions/limitations of in-office visits due to the COVID-19 pandemic, this scheduled clinical appointment was converted to a telehealth visit  I connected with Victor Davies on 12/10/19 at 4:59 p.m by telephone and verified that I am speaking with the correct person using two identifiers.  Location: Patient: house Provider: office  Wife participated in this call I discussed the limitations, risks, security and privacy concerns of performing an evaluation and management service by telephone and the availability of in person appointments. I also discussed with the patient that there may be a patient responsible charge related to this service. The patient expressed understanding and agreed to proceed.   History of Present Illness: Patient with history of HTN, HL, former tob dep, CVA., marijuana use, ETOH use disorder early remission   HTN:  Checks BP Q 2 days.  Some of his recent readings for 118/58, 134/51, 114/68 Compliant with meds and salt restrictions No CP/SOB, no headaches or dizziness  Hx of CVA: compliant with Lipitor and ASA.  ETOH:  Stopped drinking liquor and beer.  Drinks red wine.  Wife tries to limit him to 1 glass of wine a day. Still using nicotine patches but a little too expensive.  Does decrease the cravings for cigarettes.  Wife is wanting to know whether Chantix has become available as yet.  HM:  Complete COVID booster.  Plans to get the flu vaccine Outpatient Encounter Medications as of 12/10/2019  Medication Sig  . amLODipine (NORVASC) 10 MG tablet Take 1 tablet (10 mg total) by mouth daily.  Marland Kitchen aspirin EC 325 MG EC tablet Take 1 tablet (325 mg total) by mouth daily.  Marland Kitchen atorvastatin (LIPITOR) 80 MG tablet Take 1 tablet (80 mg total) by mouth daily.  . carvedilol (COREG) 6.25 MG tablet Take 1 tablet (6.25 mg total) by mouth 2 (two) times daily with a meal.  . clopidogrel (PLAVIX) 75 MG tablet TAKE 1 TABLET (75  MG TOTAL) BY MOUTH DAILY.  Marland Kitchen lisinopril (ZESTRIL) 20 MG tablet Take 1 tablet (20 mg total) by mouth daily.   No facility-administered encounter medications on file as of 12/10/2019.    Observations/Objective: No direct observation done as this was a telephone visit.  Assessment and Plan: 1. Essential hypertension At goal. Continue carvedilol lisinopril  2. History of CVA with residual deficit He is doing well.  Blood pressures well controlled.  Continue aspirin and atorvastatin.  3. Former smoker Advised patient and his wife that he may want to call [phone] quit now to request free nicotine patches.  Otherwise the other option would be a trial of bupropion.  Chantix is currently not available.  4. History of alcohol use disorder Advised patient to be very careful as he can easily relapse.  If he does drink a glass of wine, I recommend no more than one half a glass a day.  HM: Advised to get the flu vaccine and when he does his wife will send me a message via his MyChart to let me know the date that he received it and from where.  Follow Up Instructions: 4 months.   I discussed the assessment and treatment plan with the patient. The patient was provided an opportunity to ask questions and all were answered. The patient agreed with the plan and demonstrated an understanding of the instructions.   The patient was advised to call back or seek an in-person evaluation if the symptoms worsen or if the condition fails to  improve as anticipated.  I provided 9 minutes of non-face-to-face time during this encounter.   Jonah Blue, MD

## 2019-12-17 ENCOUNTER — Encounter: Payer: Self-pay | Admitting: Internal Medicine

## 2019-12-26 MED FILL — CLOPIDOGREL 75 MG TABLET: 75 | 30 days supply | Qty: 30 | Fill #2

## 2019-12-26 MED FILL — CARVEDILOL 6.25 MG TABLET: 6.25 | 30 days supply | Qty: 60 | Fill #5

## 2019-12-26 MED FILL — ATORVASTATIN CALCIUM 80 MG: 80 | 30 days supply | Qty: 30 | Fill #5

## 2019-12-26 MED FILL — LISINOPRIL 20 MG TABLET: 20 | 30 days supply | Qty: 30 | Fill #5

## 2019-12-26 MED FILL — AMLODIPINE BESYLATE 10 MG T: 10 | 30 days supply | Qty: 30 | Fill #5

## 2020-01-12 ENCOUNTER — Encounter: Payer: Self-pay | Admitting: Internal Medicine

## 2020-01-16 ENCOUNTER — Encounter: Payer: Self-pay | Admitting: Internal Medicine

## 2020-01-23 ENCOUNTER — Other Ambulatory Visit: Payer: Self-pay | Admitting: Internal Medicine

## 2020-01-23 DIAGNOSIS — I6930 Unspecified sequelae of cerebral infarction: Secondary | ICD-10-CM

## 2020-01-23 MED FILL — CLOPIDOGREL 75 MG TABLET: 75 | 90 days supply | Qty: 90 | Fill #0

## 2020-01-23 MED FILL — AMLODIPINE BESYLATE 10 MG T: 10 | 30 days supply | Qty: 30 | Fill #6

## 2020-01-23 MED FILL — LISINOPRIL 20 MG TABLET: 20 | 30 days supply | Qty: 30 | Fill #6

## 2020-01-23 MED FILL — ATORVASTATIN CALCIUM 80 MG: 80 | 30 days supply | Qty: 30 | Fill #6

## 2020-02-02 ENCOUNTER — Other Ambulatory Visit: Payer: Medicaid Other

## 2020-02-06 NOTE — Therapy (Signed)
[facility] [address] [place], [place], [address] Phone: [phone]   Fax:  [phone]  Patient Details  Name: Victor Davies MRN: [id number] Date of Birth: 07-Sep-1975 Referring Provider:  No ref. provider found  Encounter Date: 02/06/2020 PHYSICAL THERAPY DISCHARGE SUMMARY/ Non visit d/c  Visits from Start of Care: 4  Current functional level related to goals / functional outcomes: Pt last seen on 10/24/19. Has not returned since. See goals below for last assessment.   Remaining deficits: Unknown as has not returned.   Education / Equipment: HEP  Plan: Patient agrees to discharge.  Patient goals were not met. Patient is being discharged due to not returning since the last visit.  ?????         PT Short Term Goals - 10/24/19 1754      PT SHORT TERM GOAL #1   Title Pt will be independent with initial strengthening and balance HEP.    Baseline Pt has been working on his exercises as much as possible between caring for family. Denies any questions on current one.    Time 4    Period Weeks    Status Achieved    Target Date 09/15/19      PT SHORT TERM GOAL #2   Title Pt will report <3/10 LBP with functional activities for improved mobility.    Baseline 8/10 at eval. 10/24/19 denies any pain in low back currently.    Time 4    Period Weeks    Status Achieved    Target Date 09/15/19      PT SHORT TERM GOAL #3   Title Pt will ambulate >400' on varied surfaces with SPC mod I for improved mobility.    Baseline 800' supervision without AD    Time 4    Period Weeks    Status Partially Met    Target Date 09/15/19      PT SHORT TERM GOAL #4   Title Pt will decrease 5 x sit to stand from 21.23 sec to <15 sec for improved balance and functional strength.    Baseline 21.23 sec from chair without hands on 08/15/19. 10/24/19 18.6 sec without hands from chiar.    Time 4    Period Weeks    Status New    Target  Date 09/15/19           PT Long Term Goals - 10/24/19 1813      PT LONG TERM GOAL #1   Title Pt will be independent with progressive strengthening and balance HEP to continue gains on own. (Updated target dates 11/14/19)    Baseline PT continues to add to HEP    Time 8    Period Weeks    Status New    Target Date 11/14/19   updated target dates due to missing visits and delay in scheduling     PT LONG TERM GOAL #2   Title Pt will increase gait speed from 0.27ms to >1.07m for improved community mobility.    Baseline 0.8470mon 08/15/19, 10/24/19 0.32m25mithout AD    Time 8    Period Weeks    Status New    Target Date 11/14/19      PT LONG TERM GOAL #3   Title Pt will ambulate >1000' on varied surfaces without AD independently for improved community mobility.    Baseline 800' supervision without AD    Time 8    Period Weeks    Status On-going  Target Date 11/14/19      PT LONG TERM GOAL #4   Title Pt will increase DGI from 15/24 to >19/24 for improved balance and gait safety.    Baseline 15/24 on 08/15/19, 10/24/19 20/24    Time 8    Period Weeks    Status Achieved      PT LONG TERM GOAL #5   Title Pt will be able to ambulate up/down a flight of stairs with reciprocal pattern without UE support for improved functional strength and balance to enter apartment safely.    Baseline supervision with reciprocal pattern with 1 rail with 4 steps    Time 8    Period Weeks    Status New    Target Date 11/14/19           Electa Sniff, PT, DPT, NCS 02/06/2020, 11:52 AM  [facility] [address] [place], [place], [address] Phone: [phone]   Fax:  [phone]

## 2020-02-09 ENCOUNTER — Other Ambulatory Visit: Payer: Medicaid Other

## 2020-02-14 ENCOUNTER — Encounter: Payer: Self-pay | Admitting: Internal Medicine

## 2020-02-19 MED FILL — ATORVASTATIN CALCIUM 80 MG: 80 | 30 days supply | Qty: 30 | Fill #1

## 2020-02-19 MED FILL — LISINOPRIL 20 MG TABLET: 20 | 30 days supply | Qty: 30 | Fill #1

## 2020-02-19 MED FILL — AMLODIPINE BESYLATE 10 MG T: 10 | 30 days supply | Qty: 30 | Fill #2

## 2020-02-28 ENCOUNTER — Encounter: Payer: Self-pay | Admitting: Internal Medicine

## 2020-03-27 MED FILL — LISINOPRIL 20 MG TABLET: 20 | 90 days supply | Qty: 90 | Fill #2

## 2020-03-27 MED FILL — ATORVASTATIN CALCIUM 80 MG: 80 | 90 days supply | Qty: 90 | Fill #2

## 2020-03-27 MED FILL — CARVEDILOL 6.25 MG TABLET: 6.25 | 30 days supply | Qty: 60 | Fill #6

## 2020-03-27 MED FILL — AMLODIPINE BESYLATE 10 MG T: 10 | 30 days supply | Qty: 30 | Fill #3

## 2020-04-26 ENCOUNTER — Other Ambulatory Visit: Payer: Self-pay

## 2020-04-28 ENCOUNTER — Encounter: Payer: Self-pay | Admitting: Internal Medicine

## 2020-05-01 ENCOUNTER — Encounter: Payer: Self-pay | Admitting: Internal Medicine

## 2020-05-05 ENCOUNTER — Other Ambulatory Visit: Payer: Self-pay

## 2020-05-05 ENCOUNTER — Other Ambulatory Visit: Payer: Self-pay | Admitting: Internal Medicine

## 2020-05-05 DIAGNOSIS — I10 Essential (primary) hypertension: Secondary | ICD-10-CM

## 2020-05-05 MED ORDER — AMLODIPINE BESYLATE 10 MG PO TABS
ORAL_TABLET | Freq: Every day | ORAL | 0 refills | Status: DC
  Filled 2020-05-05: qty 90, 90d supply, fill #0

## 2020-05-05 NOTE — Telephone Encounter (Signed)
Requested Prescriptions  Pending Prescriptions Disp Refills  . amLODipine (NORVASC) 10 MG tablet 90 tablet 0    Sig: TAKE 1 TABLET (10 MG TOTAL) BY MOUTH DAILY.     Cardiovascular:  Calcium Channel Blockers Passed - 05/05/2020  5:13 PM      Passed - Last BP in normal range    BP Readings from Last 1 Encounters:  08/15/19 112/70         Passed - Valid encounter within last 6 months    Recent Outpatient Visits          4 months ago Essential hypertension   [facility] Marcine Matar, MD   9 months ago Need for Tdap vaccination   [facility] Lois Huxley, Cornelius Moras, RPH-CPP   9 months ago Essential hypertension   [facility] Marcine Matar, MD   11 months ago Hospital discharge follow-up   [facility] Marcine Matar, MD   12 months ago Essential hypertension   [facility] Marcine Matar, MD      Future Appointments            In 2 months Laural Benes Binnie Rail, MD [facility]

## 2020-05-06 ENCOUNTER — Other Ambulatory Visit: Payer: Self-pay

## 2020-05-08 ENCOUNTER — Other Ambulatory Visit: Payer: Self-pay

## 2020-06-03 ENCOUNTER — Other Ambulatory Visit: Payer: Self-pay | Admitting: Internal Medicine

## 2020-06-03 ENCOUNTER — Other Ambulatory Visit: Payer: Self-pay

## 2020-06-03 DIAGNOSIS — I10 Essential (primary) hypertension: Secondary | ICD-10-CM

## 2020-06-03 DIAGNOSIS — I6930 Unspecified sequelae of cerebral infarction: Secondary | ICD-10-CM

## 2020-06-03 MED ORDER — CARVEDILOL 6.25 MG PO TABS
ORAL_TABLET | Freq: Two times a day (BID) | ORAL | 1 refills | Status: DC
  Filled 2020-06-03 – 2020-06-20 (×2): qty 60, 30d supply, fill #0

## 2020-06-03 MED ORDER — AMLODIPINE BESYLATE 10 MG PO TABS
ORAL_TABLET | Freq: Every day | ORAL | 0 refills | Status: DC
  Filled 2020-06-03: qty 90, fill #0
  Filled 2020-06-20: qty 30, 30d supply, fill #0
  Filled 2020-07-18 – 2020-09-01 (×6): qty 30, 30d supply, fill #1
  Filled 2020-10-08: qty 30, 30d supply, fill #2

## 2020-06-03 MED ORDER — ATORVASTATIN CALCIUM 80 MG PO TABS
ORAL_TABLET | Freq: Every day | ORAL | 1 refills | Status: DC
  Filled 2020-06-03 – 2020-06-20 (×2): qty 30, 30d supply, fill #0
  Filled 2020-07-18 – 2020-07-25 (×2): qty 30, 30d supply, fill #1

## 2020-06-03 MED FILL — Lisinopril Tab 20 MG: ORAL | 30 days supply | Qty: 30 | Fill #0 | Status: CN

## 2020-06-03 NOTE — Telephone Encounter (Signed)
Appointment 6/14- RF granted

## 2020-06-04 ENCOUNTER — Other Ambulatory Visit: Payer: Self-pay

## 2020-06-11 ENCOUNTER — Other Ambulatory Visit: Payer: Self-pay

## 2020-06-12 IMAGING — CT CT CHEST W/O CM
2 of 3 series · 15 of 36 positions shown, 18 images · non-contrast
Comparison: Two-view chest x-ray 05/07/2019

CLINICAL DATA: Lung nodule.  Abnormal chest x-ray.

EXAM:
CT CHEST WITHOUT CONTRAST
TECHNIQUE: Multidetector CT imaging of the chest was performed following the
standard protocol without IV contrast.

[Series 2: thorax · axial · 0.66mm/px · z∈[+1457,+1707]mm · 12 of 147 slices shown, 15 images]
[im 11/147  mediastinal]
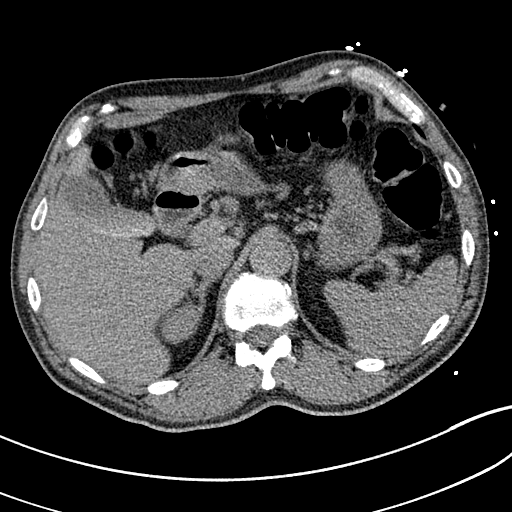
[im 11/147  lung]
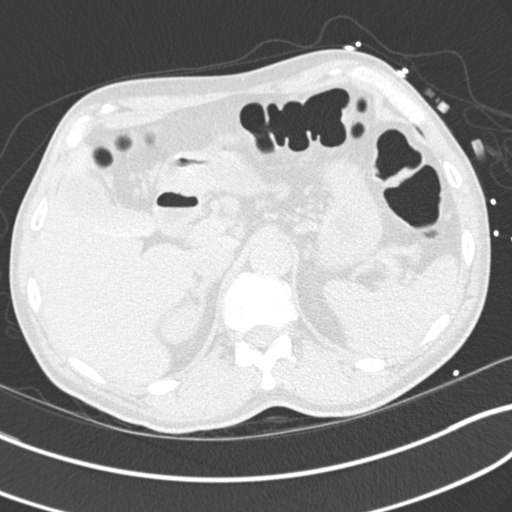
[im 22/147  lung]
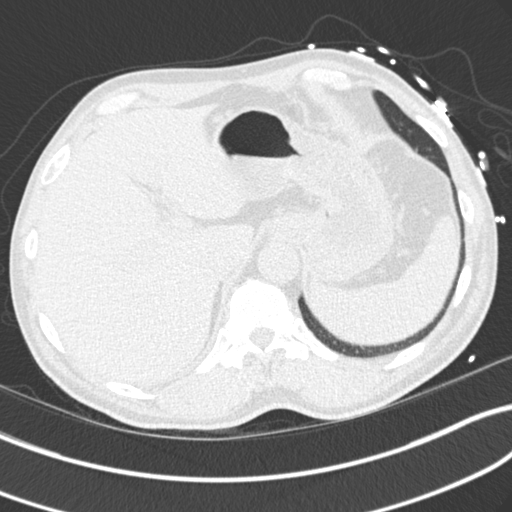
[im 33/147  lung]
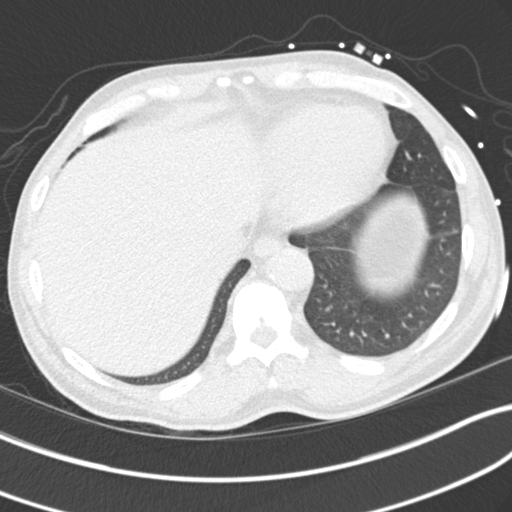
[im 44/147  lung]
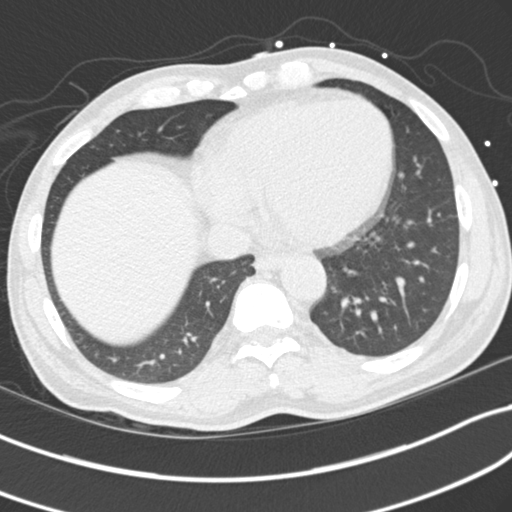
[im 55/147  mediastinal]
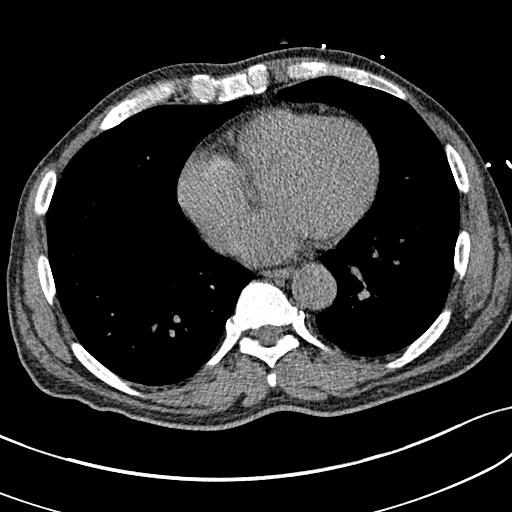
[im 55/147  lung]
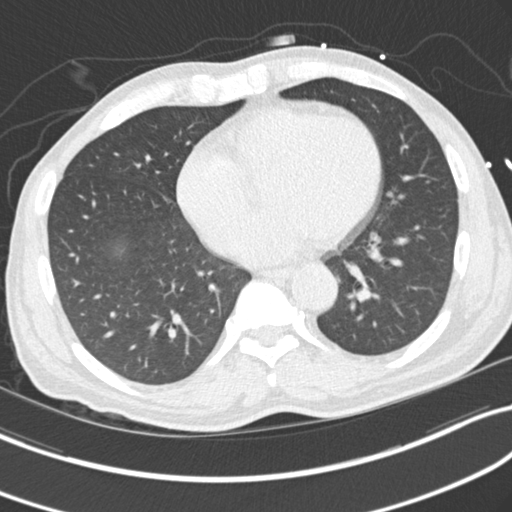
[im 65/147  lung]
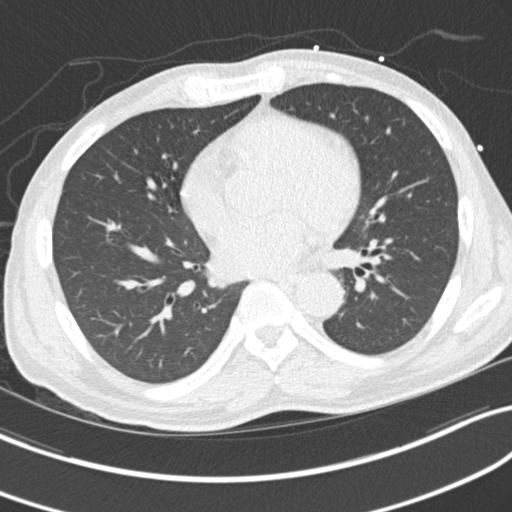
[im 82/147  lung]
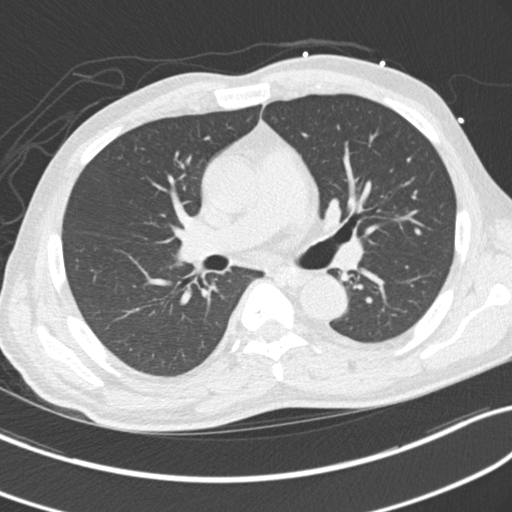
[im 92/147  lung]
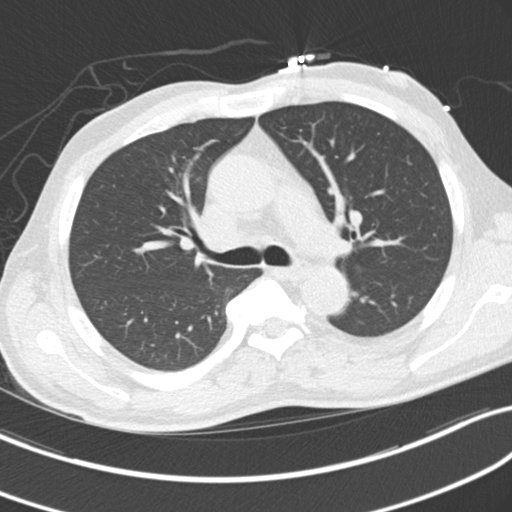
[im 103/147  mediastinal]
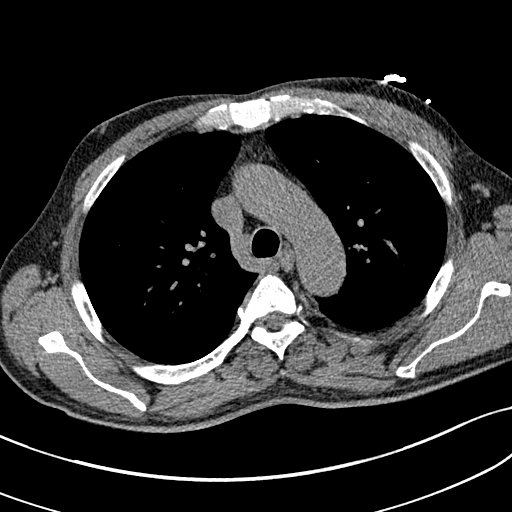
[im 103/147  lung]
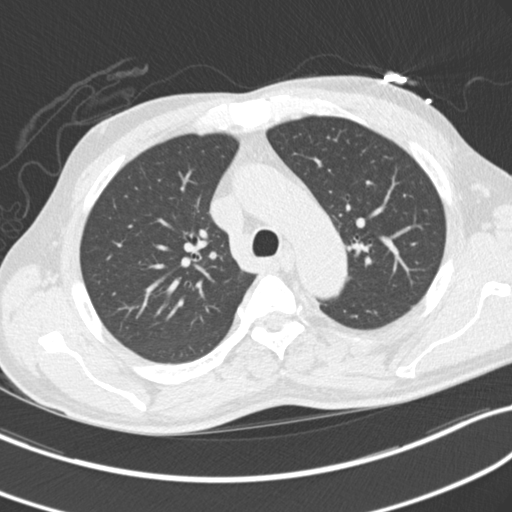
[im 114/147  lung]
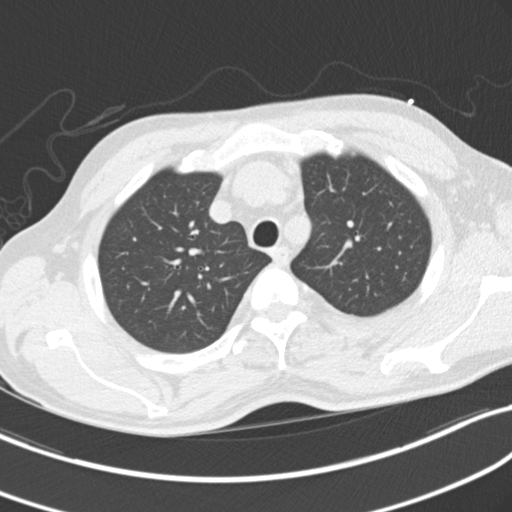
[im 125/147  lung]
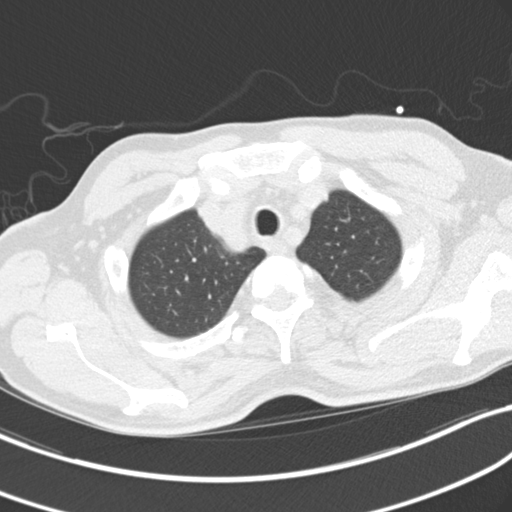
[im 136/147  lung]
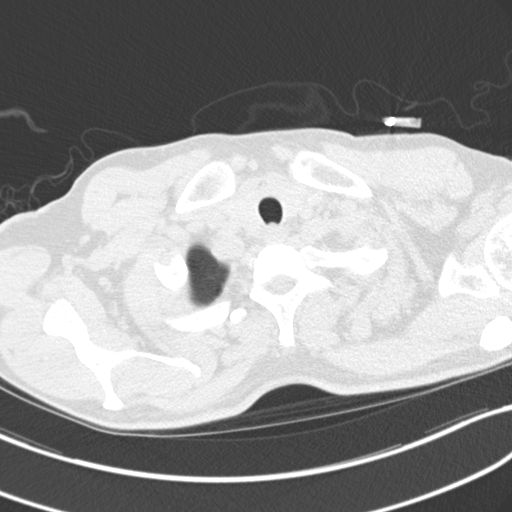

[Series 5: coronal · coronal · 0.59mm/px · 3 of 116 slices shown]
[im 24/116  lung]
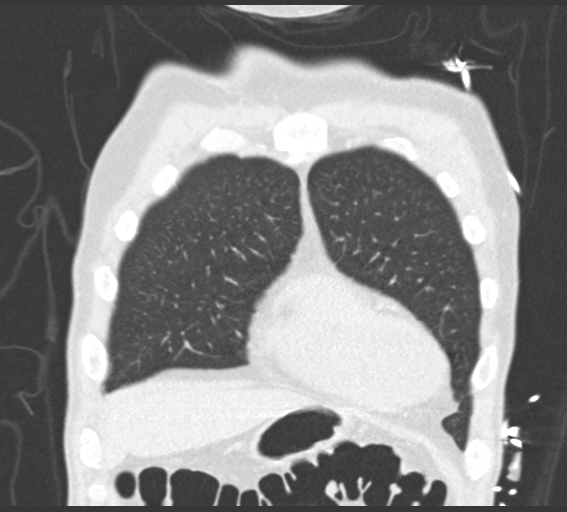
[im 47/116  lung]
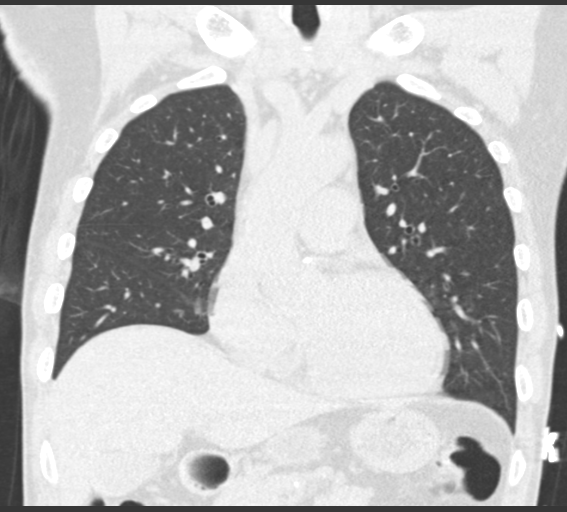
[im 70/116  lung]
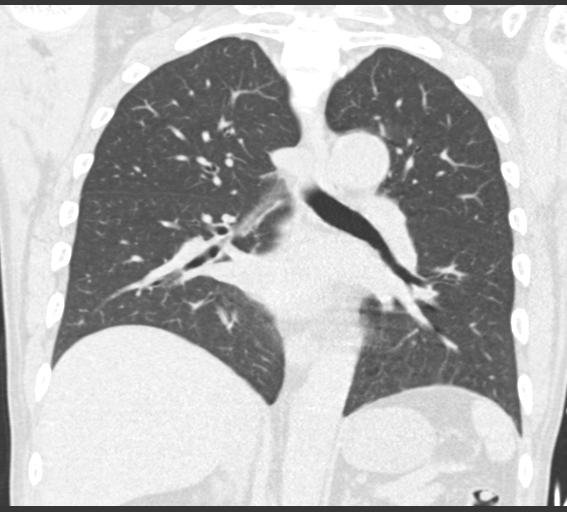

[15 of 36 positions shown; findings below may reference images not displayed]

FINDINGS: Cardiovascular: Heart size is normal. Left mainstem coronary artery
calcification is present. Minimal calcifications are present at the
aortic arch. No significant stenosis is present. No aneurysm is
present. Pulmonary artery size is normal.

Mediastinum/Nodes: No significant mediastinal, hilar, or axillary
adenopathy present.

Lungs/Pleura: Are clear without focal nodule, mass, or airspace
disease.

Upper Abdomen: Punctate nonobstructing stone is present in the right
kidney. Water density 16 mm exophytic lesion in the left kidney
likely represents a simple cyst. No other focal lesions are present.

Musculoskeletal: Prominent syndesmophyte on the right at C5-6
corresponds to the density seen on the chest x-ray. Other focal
osseous lesions are present. Vertebral body heights are maintained.
IMPRESSION: 1. Prominent right-sided syndesmophyte corresponds to the density
seen on the chest x-ray. No follow-up needed.
2. Calcification at the level of the left main coronary artery.
Recommend cardiology consultation.
3. No pulmonary nodule.

## 2020-06-12 IMAGING — CT CT ANGIO NECK
1 of 10 series · 6 of 33 positions shown · IV contrast (OMNIPAQUE 350)
Comparison: Prior brain MRI from 05/16/2019.

CLINICAL DATA: Follow-up examination for acute stroke.

EXAM:
CT ANGIOGRAPHY HEAD AND NECK
TECHNIQUE: Multidetector CT imaging of the head and neck was performed using
the standard protocol during bolus administration of intravenous
contrast. Multiplanar CT image reconstructions and MIPs were
obtained to evaluate the vascular anatomy. Carotid stenosis
measurements (when applicable) are obtained utilizing NASCET
criteria, using the distal internal carotid diameter as the
denominator.
CONTRAST:  100mL OMNIPAQUE IOHEXOL 350 MG/ML SOLN

[Series 11: ax thin · axial · 0.39mm/px · z∈[+1149,+1386]mm · 6 of 333 slices shown]
[im 48/333  soft-tissue]
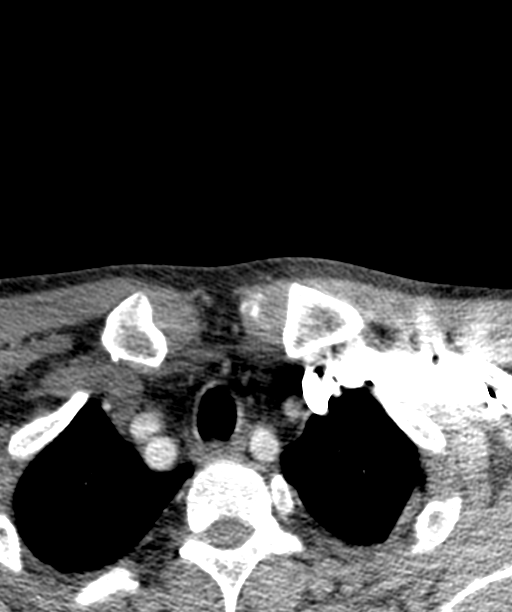
[im 95/333  bone]
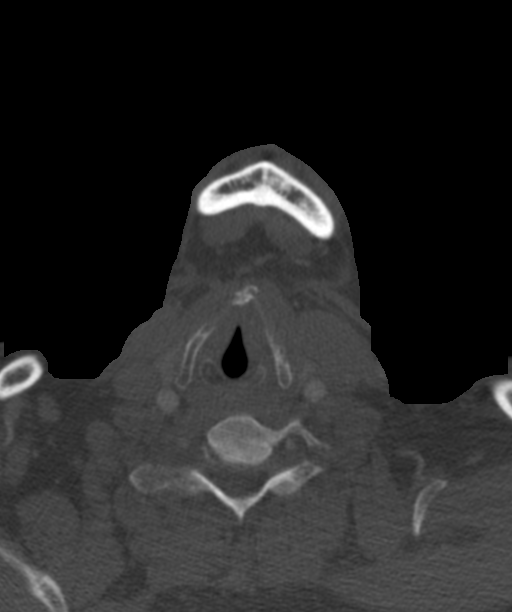
[im 143/333  soft-tissue]
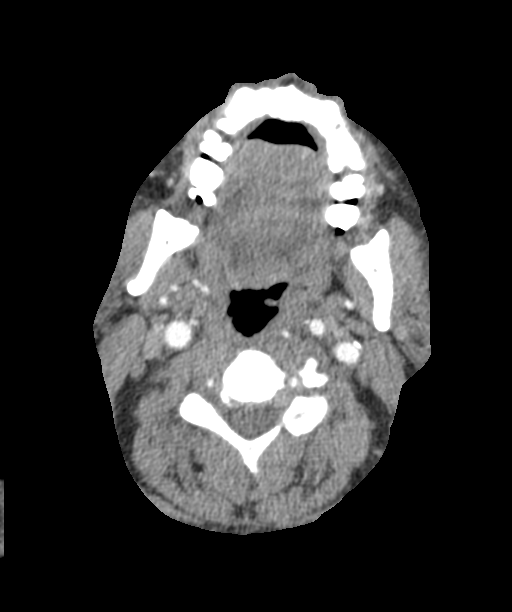
[im 190/333  bone]
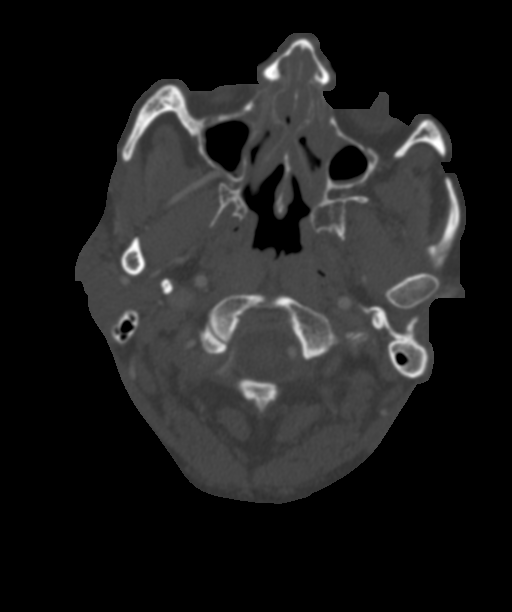
[im 238/333  soft-tissue]
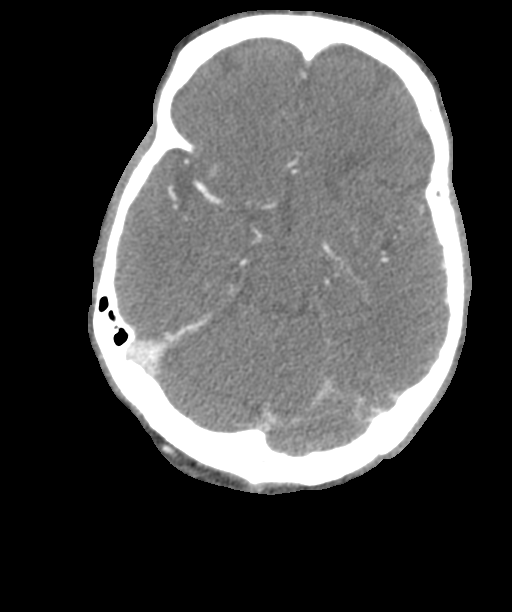
[im 285/333  bone]
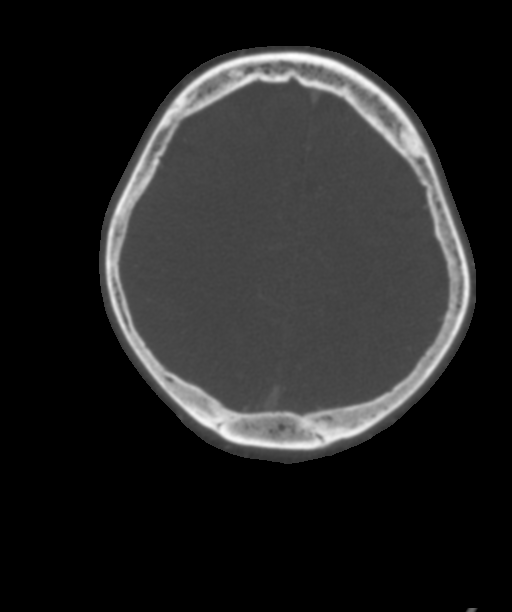

[6 of 33 positions shown; findings below may reference images not displayed]

FINDINGS: CT HEAD FINDINGS

Brain: Mildly advanced cerebral atrophy for age. Advanced chronic
microvascular ischemic disease with multiple remote lacunar infarcts
clustered about the deep gray nuclei and pons, better seen on prior
brain MRI. Previously identified small strokes not visible by CT. No
intracranial hemorrhage. No mass lesion, midline shift or mass
effect. No hydrocephalus or extra-axial fluid collection.

Vascular: No appreciable hyperdense vessel, better evaluated on
concomitant CTA. Scattered atherosclerotic change at the skull base.

Skull: Scalp soft tissues and calvarium within normal limits.

Sinuses: Extensive acute on chronic pan sinusitis as previously
described again noted. Mastoid air cells remain clear.

Orbits: Globes and orbital soft tissues demonstrate no acute
finding.

Review of the MIP images confirms the above findings

CTA NECK FINDINGS

Aortic arch: Visualized arch of normal caliber with normal branch
pattern. No hemodynamically significant stenosis seen about the
origin of the great vessels. Visualized subclavian arteries widely
patent.

Right carotid system: Right common carotid artery patent from its
origin to the bifurcation without stenosis. Mild eccentric calcified
plaque at the proximal right ICA without stenosis. Right ICA widely
patent distally without stenosis, dissection or occlusion.

Left carotid system: Left CCA patent from its origin to the
bifurcation without stenosis. Scattered calcified plaque about the
left bifurcation/proximal left ICA with associated stenosis of up to
approximately 45-50% by NASCET criteria. Left ICA otherwise patent
distally to the skull base without stenosis, dissection, or
occlusion.

Vertebral arteries: Both vertebral arteries arise from the
subclavian arteries. Left vertebral artery dominant, with a
diffusely hypoplastic right vertebral artery. Vertebral body res
widely patent within the neck without stenosis or other abnormality.

Skeleton: No acute osseous finding. No discrete or worrisome osseous
lesions. Poor dentition with innumerable dental caries and
periapical lucencies noted.

Other neck: No other acute soft tissue abnormality within the neck.
No mass lesion or adenopathy.

Upper chest: Visualized upper chest demonstrates no acute finding.

Review of the MIP images confirms the above findings

CTA HEAD FINDINGS

Anterior circulation: Petrous segments patent bilaterally. Scattered
atheromatous irregularity within the cavernous/supraclinoid ICAs
bilaterally, left slightly greater than right. Short-segment
moderate stenosis seen involving the anterior genu of the cavernous
left ICA (series 11, image 225). No significant narrowing about the
right carotid siphon. ICA termini well perfused. A1 segments patent
bilaterally. Normal anterior communicating artery complex. Right A2
segment widely patent to its distal aspect. Short-segment severe
stenosis seen involving the mid left A2 segment (series 14, image
49). No M1 stenosis or occlusion. Negative MCA bifurcations. Distal
MCA branches well perfused and symmetric.

Posterior circulation: Dominant left vertebral artery irregular but
patent to the vertebrobasilar junction without stenosis. Hypoplastic
right vertebral artery largely terminates in PICA, and is either
occluded or severely stenotic distally. Vertebrobasilar junction and
proximal basilar artery are irregular with a dolichoectatic
appearance. The mid and distal basilar artery are diminutive but
patent. Superior cerebral arteries grossly patent bilaterally.
Predominant fetal type origin of both PCAs. PCAs grossly patent to
their distal aspects without stenosis.

Venous sinuses: Grossly patent allowing for timing the contrast
bolus.

Anatomic variants: Predominant fetal type origin of both PCAs.

Review of the MIP images confirms the above findings
IMPRESSION: CT HEAD IMPRESSION:

1. Advanced chronic microvascular ischemic disease for age with
multiple remote lacunar infarcts involving the deep gray nuclei and
pons, better characterized on recent brain MRI. Recently identified
subcentimeter infarcts not visible by CT.
2. No other new acute intracranial abnormality.
3. Acute on chronic pansinusitis.

CTA HEAD AND NECK IMPRESSION:

1. Negative CTA for large vessel occlusion.
2. Irregular dolichoectatic appearance of the vertebrobasilar
junction and proximal basilar artery. The mid and distal basilar
artery are diminutive but patent, with predominant fetal type origin
of both PCAs.
3. Hypoplastic right vertebral artery largely terminates in PICA,
with either occlusion or severe stenosis distally to the
vertebrobasilar junction. Dominant left vertebral artery patent
without high-grade stenosis.
4. Atheromatous plaque about the left carotid bifurcation with
estimated 45-50% stenosis by NASCET criteria.
5. Short-segment severe mid left A2 stenosis.

## 2020-06-14 ENCOUNTER — Encounter: Payer: Self-pay | Admitting: Internal Medicine

## 2020-06-20 ENCOUNTER — Other Ambulatory Visit: Payer: Self-pay

## 2020-06-20 ENCOUNTER — Other Ambulatory Visit: Payer: Self-pay | Admitting: Internal Medicine

## 2020-06-20 DIAGNOSIS — I10 Essential (primary) hypertension: Secondary | ICD-10-CM

## 2020-06-20 MED ORDER — LISINOPRIL 20 MG PO TABS
ORAL_TABLET | Freq: Every day | ORAL | 0 refills | Status: DC
  Filled 2020-06-20: qty 30, 30d supply, fill #0

## 2020-06-20 NOTE — Telephone Encounter (Signed)
Requested medication (s) are due for refill today: Yes  Requested medication (s) are on the active medication list: Yes  Last refill:  06/05/19  Future visit scheduled: Yes  Notes to clinic:  Unable to refill per protocol, Rx expired.      Requested Prescriptions  Pending Prescriptions Disp Refills   lisinopril (ZESTRIL) 20 MG tablet 90 tablet 1    Sig: TAKE 1 TABLET (20 MG TOTAL) BY MOUTH DAILY.      Cardiovascular:  ACE Inhibitors Failed - 06/20/2020  9:12 AM      Failed - Cr in normal range and within 180 days    Creatinine, Ser  Date Value Ref Range Status  05/19/2019 0.86 0.61 - 1.24 mg/dL Final          Failed - K in normal range and within 180 days    Potassium  Date Value Ref Range Status  05/19/2019 3.9 3.5 - 5.1 mmol/L Final    Comment:    SPECIMEN HEMOLYZED. HEMOLYSIS MAY AFFECT INTEGRITY OF RESULTS.          Failed - Valid encounter within last 6 months    Recent Outpatient Visits           6 months ago Essential hypertension   [facility] Marcine Matar, MD   10 months ago Need for Tdap vaccination   [facility] Lois Huxley, Cornelius Moras, RPH-CPP   10 months ago Essential hypertension   [facility] Marcine Matar, MD   1 year ago Hospital discharge follow-up   [facility] Marcine Matar, MD   1 year ago Essential hypertension   [facility] Marcine Matar, MD       Future Appointments             In 2 weeks Marcine Matar, MD [facility]             Passed - Patient is not pregnant      Passed - Last BP in normal range    BP Readings from Last 1 Encounters:  08/15/19 112/70

## 2020-06-26 ENCOUNTER — Other Ambulatory Visit: Payer: Self-pay

## 2020-07-08 ENCOUNTER — Other Ambulatory Visit: Payer: Self-pay

## 2020-07-08 ENCOUNTER — Encounter: Payer: Self-pay | Admitting: Internal Medicine

## 2020-07-08 ENCOUNTER — Ambulatory Visit: Payer: Medicaid Other | Attending: Internal Medicine | Admitting: Internal Medicine

## 2020-07-08 VITALS — BP 102/66 | HR 87 | Resp 16 | Wt 157.6 lb

## 2020-07-08 DIAGNOSIS — Z79899 Other long term (current) drug therapy: Secondary | ICD-10-CM | POA: Insufficient documentation

## 2020-07-08 DIAGNOSIS — E785 Hyperlipidemia, unspecified: Secondary | ICD-10-CM | POA: Diagnosis not present

## 2020-07-08 DIAGNOSIS — Z87891 Personal history of nicotine dependence: Secondary | ICD-10-CM | POA: Insufficient documentation

## 2020-07-08 DIAGNOSIS — Z8249 Family history of ischemic heart disease and other diseases of the circulatory system: Secondary | ICD-10-CM | POA: Insufficient documentation

## 2020-07-08 DIAGNOSIS — Z7982 Long term (current) use of aspirin: Secondary | ICD-10-CM | POA: Diagnosis not present

## 2020-07-08 DIAGNOSIS — G4700 Insomnia, unspecified: Secondary | ICD-10-CM | POA: Diagnosis not present

## 2020-07-08 DIAGNOSIS — Z8673 Personal history of transient ischemic attack (TIA), and cerebral infarction without residual deficits: Secondary | ICD-10-CM | POA: Insufficient documentation

## 2020-07-08 DIAGNOSIS — I10 Essential (primary) hypertension: Secondary | ICD-10-CM | POA: Insufficient documentation

## 2020-07-08 DIAGNOSIS — R55 Syncope and collapse: Secondary | ICD-10-CM | POA: Diagnosis not present

## 2020-07-08 DIAGNOSIS — R739 Hyperglycemia, unspecified: Secondary | ICD-10-CM

## 2020-07-08 DIAGNOSIS — F39 Unspecified mood [affective] disorder: Secondary | ICD-10-CM | POA: Diagnosis not present

## 2020-07-08 DIAGNOSIS — D649 Anemia, unspecified: Secondary | ICD-10-CM

## 2020-07-08 MED ORDER — NICOTINE POLACRILEX 2 MG MT GUM
2.0000 mg | CHEWING_GUM | OROMUCOSAL | 3 refills | Status: DC | PRN
  Filled 2020-07-08: qty 100, fill #0

## 2020-07-08 MED ORDER — SERTRALINE HCL 50 MG PO TABS
ORAL_TABLET | ORAL | 1 refills | Status: DC
  Filled 2020-07-08 (×2): qty 15, 30d supply, fill #0
  Filled 2020-08-03: qty 15, 30d supply, fill #1
  Filled 2020-09-01: qty 15, 30d supply, fill #2
  Filled 2020-10-08: qty 15, 30d supply, fill #3

## 2020-07-08 MED ORDER — CARVEDILOL 6.25 MG PO TABS: 3.1250 mg | ORAL_TABLET | Freq: Two times a day (BID) | ORAL | 1 refills | Status: DC

## 2020-07-08 NOTE — Progress Notes (Addendum)
Patient ID: Victor Davies, male    DOB: 10-Sep-1975  MRN: [id number]  CC: Hypertension, Insomnia, and Leg Pain (Right )   Subjective: Victor Davies is a 45 y.o. male who presents for chronic ds management.  Wife and their two young boys are with him His concerns today include:  Patient with history of HTN, HL, former tob dep, CVA., marijuana use, ETOH use disorder early remission  HTN: Patient reports compliance with his medications that include amlodipine, lisinopril and carvedilol.  Wife helps him in remembering to take his medications.  He limits salt in the foods. -Denies chest pains, shortness of breath, palpitations.  Occasional dizziness. -Reports syncope episode a few weeks ago.  He was outside cleaning the pool on a hot day and the next thing he knew he woke up on the ground.  He does not recall events leading up to it or how he ended up on the ground.  He felt confused for several minutes.  No previous fainting spells.  He did not remember to tell his wife about it until several days after the fact.  Wife reports that he has been having mood swings where he gets upset about little things especially in regards to the kids.  He gets frustrated and irritable and starts raising his voice.  Kids are with him during the day while the wife is at work.  They both states that there is no concern for abuse of the children by him.  They are both wanting to know if there is a medication that can help even out his mood.  Even though he has quit smoking, he still gets urges to smoke.  On a few occasion wife states that he tried to purchase a pack of cigarettes.  He was using the nicotine patches every day for and after a while he would use them every couple of days.  Now he uses them only when he gets the urge to smoke.  Wants to know whether Chantix is available.  Other concern is with problems sleeping.  This has been going on for few months.  They both get in bed between 9:30 to 10:30 PM.   All lights and sounds are turned off.  However that 100-year-old son sleeps in the bed between them and even though the lights are off, it takes a while for the child to settle down and fall asleep.  Patient states that he himself falls asleep between 12 AM to 1 AM and then gets up around 5 AM and not able to go back to sleep.  He drinks 2 glasses of wine at dinner around 7 PM.  He has been taking melatonin but has not found it helpful.  Patient Active Problem List   Diagnosis Date Noted   History of CVA (cerebrovascular accident)    Hypertension    Former tobacco use 08/06/2019   History of CVA with residual deficit 06/05/2019   Acute ischemic stroke [facility])    Alcohol use disorder, severe, in early remission (HCC)    Hypokalemia    Marijuana abuse    Acute CVA (cerebrovascular accident) (HCC) 05/17/2019   Essential hypertension 05/10/2019   CVA, old, alterations of sensations 05/10/2019   Opacity of lung on imaging study 05/10/2019     Current Outpatient Medications on File Prior to Visit  Medication Sig Dispense Refill   amLODipine (NORVASC) 10 MG tablet TAKE 1 TABLET (10 MG TOTAL) BY MOUTH DAILY. 90 tablet 0   aspirin EC 325  MG EC tablet Take 1 tablet (325 mg total) by mouth daily. 30 tablet 0   atorvastatin (LIPITOR) 80 MG tablet TAKE 1 TABLET (80 MG TOTAL) BY MOUTH DAILY. 30 tablet 1   carvedilol (COREG) 6.25 MG tablet TAKE 1 TABLET (6.25 MG TOTAL) BY MOUTH 2 (TWO) TIMES DAILY WITH A MEAL. 60 tablet 1   lisinopril (ZESTRIL) 20 MG tablet TAKE 1 TABLET (20 MG TOTAL) BY MOUTH DAILY. 30 tablet 0   No current facility-administered medications on file prior to visit.    No Known Allergies  Social History   Socioeconomic History   Marital status: Married    Spouse name: [name]    Number of children: 2   Years of education: Not on file   Highest education level: 9th grade  Occupational History   Occupation: unemployed  Tobacco Use   Smoking status: Some Days    Packs/day: 0.25     Years: 20.00    Pack years: 5.00    Types: Cigarettes   Smokeless tobacco: Never  Vaping Use   Vaping Use: Never used  Substance and Sexual Activity   Alcohol use: Yes    Alcohol/week: 14.0 - 21.0 standard drinks    Types: 14 - 21 Cans of beer per week   Drug use: Not on file   Sexual activity: Not on file  Other Topics Concern   Not on file  Social History Narrative   Not on file   Social Determinants of Health   Financial Resource Strain: Not on file  Food Insecurity: Not on file  Transportation Needs: Not on file  Physical Activity: Not on file  Stress: Not on file  Social Connections: Not on file  Intimate Partner Violence: Not on file    Family History  Problem Relation Age of Onset   Hypertension Father    Arthritis Mother     No past surgical history on file.  ROS: Review of Systems Negative except as stated above  PHYSICAL EXAM: BP 102/66   Pulse 87   Resp 16   Wt 157 lb 9.6 oz (71.5 kg)   SpO2 99%   BMI 23.27 kg/m   Physical Exam BP sitting 99/64, pulse 65 BP standing 111/78, pulse 73  General appearance - alert, well appearing, middle-age male and in no distress Mental status -patient with flat affect.  He is a bit forgetful. Mouth - mucous membranes moist, pharynx normal without lesions Neck - supple, no significant adenopathy.  No carotid bruits Chest - clear to auscultation, no wheezes, rales or rhonchi, symmetric air entry Heart - normal rate, regular rhythm, normal S1, S2, no murmurs, rubs, clicks or gallops Extremities - peripheral pulses normal, no pedal edema, no clubbing or cyanosis  Depression screen [facility] 2/9 07/08/2020 08/06/2019 06/05/2019  Decreased Interest 0 0 0  Down, Depressed, Hopeless 0 0 0  PHQ - 2 Score 0 0 0    CMP Latest Ref Rng & Units 05/19/2019 05/18/2019 05/17/2019  Glucose 70 - 99 mg/dL 97 97 993(Z)  BUN 6 - 20 mg/dL 11 8 9   Creatinine 0.61 - 1.24 mg/dL 1.69 6.78  Sodium 135 - 145 mmol/L 140 139 137  Potassium  3.5 - 5.1 mmol/L 3.9 3.4(L) 3.6  Chloride 98 - 111 mmol/L 106 103 104  CO2 22 - 32 mmol/L 26 25 24   Calcium 8.9 - 10.3 mg/dL 9.3 9.2 9.1  Total Protein 6.5 - 8.1 g/dL - - 7.1  Total Bilirubin 0.3 - 1.2 mg/dL - -  1.4(H)  Alkaline Phos 38 - 126 U/L - - 60  AST 15 - 41 U/L - - 15  ALT 0 - 44 U/L - - 17   Lipid Panel     Component Value Date/Time   CHOL 105 05/17/2019 0101   CHOL 247 (H) 05/10/2019 1606   TRIG 26 05/17/2019 0101   HDL 37 (L) 05/17/2019 0101   HDL 69 05/10/2019 1606   CHOLHDL 2.8 05/17/2019 0101   VLDL 5 05/17/2019 0101   LDLCALC 63 05/17/2019 0101   LDLCALC 166 (H) 05/10/2019 1606    CBC    Component Value Date/Time   WBC 7.9 05/19/2019 0435   RBC 5.47 05/19/2019 0435   HGB 16.7 05/19/2019 0435   HCT 49.9 05/19/2019 0435   PLT 338 05/19/2019 0435   MCV 91.2 05/19/2019 0435   MCH 30.5 05/19/2019 0435   MCHC 33.5 05/19/2019 0435   RDW 12.8 05/19/2019 0435   LYMPHSABS 2.5 05/17/2019 0617   MONOABS 0.6 05/17/2019 0617   EOSABS 0.2 05/17/2019 0617   BASOSABS 0.0 05/17/2019 0617   EKG sinus bradycardia with rate of 57 and early repolarization changes unchanged from previous EKG in the system.  ASSESSMENT AND PLAN:  1. Essential hypertension Blood pressure running in the low normal range.  Given the sinus bradycardia, and recent syncope, I recommend decreasing carvedilol to 3.125 mg twice a day.  Continue to monitor blood pressure at home.  If blood pressure starts to increase he or his wife will send me a MyChart message to let me know. - CBC - Comprehensive metabolic panel - Lipid panel - carvedilol (COREG) 6.25 MG tablet; Take 0.5 tablets (3.125 mg total) by mouth 2 (two) times daily with a meal.  Dispense: 60 tablet; Refill: 1  2. Syncope, unspecified syncope type Differential diagnosis includes vasovagal syncope from being out in the heat, arrhythmia, seizure. Patient had echo done a little over a year ago. Advised patient to avoid working outdoors in  the heat of the day and to stay hydrated.  If he has any further episodes he will let me know. - EKG 12-Lead  3. Mood disorder (HCC) Discussed putting him on a low-dose of Zoloft.  Patient and wife agreeable to this.  I went over possible side effects including sexual dysfunction and mild weight gain. - sertraline (ZOLOFT) 50 MG tablet; TAKE 1/2 tablet by mouth daily x 1 month then 1 tablet daily.  Dispense: 30 tablet; Refill: 1  4. Insomnia, unspecified type Good sleep hygiene discussed.  Advised patient that if he does not fall asleep until about 12 midnight he should get in bed later but then 10:30 PM.  Once in bed he should turn off all lights and sounds.  If unable to fall asleep within 30 to 45 minutes of getting in bed, he should get up and do something until he feels sleepy and then try getting in bed again.  Advised that the try to avoid having the child sleep in the bed with them as this can also disturb/interrupt good sleep  5. Former smoker Advised patient that he can continue using the patches for several months to help decrease cravings for the cigarettes.  Also discussed trying the nicotine gum to chew to help decrease cravings.  He is agreeable to trying the gum. - nicotine polacrilex (NICORETTE) 2 MG gum; Take 1 each (2 mg total) by mouth as needed for smoking cessation. Max 30 pieces/day  Dispense: 100 tablet; Refill: 3  Patient was given the opportunity to ask questions.  Patient verbalized understanding of the plan and was able to repeat key elements of the plan.  Addendum 07/09/2020: Patient with elevated blood sugar on lab test.  We will add A1c.  Patient with significant drop in hemoglobin since April of last year.  We will add iron studies, vitamin B12 and folate levels.  Will refer to GI for endoscopy/colonoscopy.  Patient not on Plavix.  He is on aspirin.  We will inquire whether he is noted any blood in the stools or black stools via his MyChart account. No orders of the  defined types were placed in this encounter.    Requested Prescriptions    No prescriptions requested or ordered in this encounter    No follow-ups on file.  Jonah Blueeborah Elianne Gubser, MD, FACP

## 2020-07-08 NOTE — Progress Notes (Signed)
Other concerns are in MyChart message

## 2020-07-08 NOTE — Patient Instructions (Addendum)
We have decreased the dose of Carvedilol to 6.25 mg 1/2 tablet twice a day.  Let me know if blood pressure starts increasing.  Let me know if you have any further fainting episodes.   Insomnia Insomnia is a sleep disorder that makes it difficult to fall asleep or stay asleep. Insomnia can cause fatigue, low energy, difficulty concentrating, moodswings, and poor performance at work or school. There are three different ways to classify insomnia: Difficulty falling asleep. Difficulty staying asleep. Waking up too early in the morning. Any type of insomnia can be long-term (chronic) or short-term (acute). Both are common. Short-term insomnia usually lasts for three months or less. Chronic insomnia occurs at least three times a week for longer than threemonths. What are the causes? Insomnia may be caused by another condition, situation, or substance, such as: Anxiety. Certain medicines. Gastroesophageal reflux disease (GERD) or other gastrointestinal conditions. Asthma or other breathing conditions. Restless legs syndrome, sleep apnea, or other sleep disorders. Chronic pain. Menopause. Stroke. Abuse of alcohol, tobacco, or illegal drugs. Mental health conditions, such as depression. Caffeine. Neurological disorders, such as Alzheimer's disease. An overactive thyroid (hyperthyroidism). Sometimes, the cause of insomnia may not be known. What increases the risk? Risk factors for insomnia include: Gender. Women are affected more often than men. Age. Insomnia is more common as you get older. Stress. Lack of exercise. Irregular work schedule or working night shifts. Traveling between different time zones. Certain medical and mental health conditions. What are the signs or symptoms? If you have insomnia, the main symptom is having trouble falling asleep or having trouble staying asleep. This may lead to other symptoms, such as: Feeling fatigued or having low energy. Feeling nervous about  going to sleep. Not feeling rested in the morning. Having trouble concentrating. Feeling irritable, anxious, or depressed. How is this diagnosed? This condition may be diagnosed based on: Your symptoms and medical history. Your health care provider may ask about: Your sleep habits. Any medical conditions you have. Your mental health. A physical exam. How is this treated? Treatment for insomnia depends on the cause. Treatment may focus on treating an underlying condition that is causing insomnia. Treatment may also include: Medicines to help you sleep. Counseling or therapy. Lifestyle adjustments to help you sleep better. Follow these instructions at home: Eating and drinking  Limit or avoid alcohol, caffeinated beverages, and cigarettes, especially close to bedtime. These can disrupt your sleep. Do not eat a large meal or eat spicy foods right before bedtime. This can lead to digestive discomfort that can make it hard for you to sleep.  Sleep habits  Keep a sleep diary to help you and your health care provider figure out what could be causing your insomnia. Write down: When you sleep. When you wake up during the night. How well you sleep. How rested you feel the next day. Any side effects of medicines you are taking. What you eat and drink. Make your bedroom a dark, comfortable place where it is easy to fall asleep. Put up shades or blackout curtains to block light from outside. Use a white noise machine to block noise. Keep the temperature cool. Limit screen use before bedtime. This includes: Watching TV. Using your smartphone, tablet, or computer. Stick to a routine that includes going to bed and waking up at the same times every day and night. This can help you fall asleep faster. Consider making a quiet activity, such as reading, part of your nighttime routine. Try to avoid taking naps  during the day so that you sleep better at night. Get out of bed if you are still awake  after 15 minutes of trying to sleep. Keep the lights down, but try reading or doing a quiet activity. When you feel sleepy, go back to bed.  General instructions Take over-the-counter and prescription medicines only as told by your health care provider. Exercise regularly, as told by your health care provider. Avoid exercise starting several hours before bedtime. Use relaxation techniques to manage stress. Ask your health care provider to suggest some techniques that may work well for you. These may include: Breathing exercises. Routines to release muscle tension. Visualizing peaceful scenes. Make sure that you drive carefully. Avoid driving if you feel very sleepy. Keep all follow-up visits as told by your health care provider. This is important. Contact a health care provider if: You are tired throughout the day. You have trouble in your daily routine due to sleepiness. You continue to have sleep problems, or your sleep problems get worse. Get help right away if: You have serious thoughts about hurting yourself or someone else. If you ever feel like you may hurt yourself or others, or have thoughts about taking your own life, get help right away. You can go to your nearest emergency department or call: Your local emergency services (911 in the [place] A suicide crisis helpline, such as the National Suicide Prevention Lifeline at 763-575-0332. This is open 24 hours a day. Summary Insomnia is a sleep disorder that makes it difficult to fall asleep or stay asleep. Insomnia can be long-term (chronic) or short-term (acute). Treatment for insomnia depends on the cause. Treatment may focus on treating an underlying condition that is causing insomnia. Keep a sleep diary to help you and your health care provider figure out what could be causing your insomnia. This information is not intended to replace advice given to you by your health care provider. Make sure you discuss any questions you have  with your healthcare provider. Document Revised: 11/22/2019 Document Reviewed: 11/22/2019 Elsevier Patient Education  2022 [employer].

## 2020-07-09 ENCOUNTER — Other Ambulatory Visit: Payer: Self-pay

## 2020-07-09 LAB — CBC
Hematocrit: 36.1 % — ABNORMAL LOW (ref 37.5–51.0)
Hemoglobin: 11.8 g/dL — ABNORMAL LOW (ref 13.0–17.7)
MCH: 29.6 pg (ref 26.6–33.0)
MCHC: 32.7 g/dL (ref 31.5–35.7)
MCV: 91 fL (ref 79–97)
Platelets: 296 10*3/uL (ref 150–450)
RBC: 3.99 x10E6/uL — ABNORMAL LOW (ref 4.14–5.80)
RDW: 12.7 % (ref 11.6–15.4)
WBC: 9.4 10*3/uL (ref 3.4–10.8)

## 2020-07-09 LAB — LIPID PANEL
Chol/HDL Ratio: 2 ratio (ref 0.0–5.0)
Cholesterol, Total: 143 mg/dL (ref 100–199)
HDL: 70 mg/dL (ref >39)
LDL Chol Calc (NIH): 57 mg/dL (ref 0–99)
Triglycerides: 85 mg/dL (ref 0–149)
VLDL Cholesterol Cal: 16 mg/dL (ref 5–40)

## 2020-07-09 LAB — COMPREHENSIVE METABOLIC PANEL
ALT: 11 IU/L (ref 0–44)
AST: 15 IU/L (ref 0–40)
Albumin/Globulin Ratio: 2 (ref 1.2–2.2)
Albumin: 4.6 g/dL (ref 4.0–5.0)
Alkaline Phosphatase: 72 IU/L (ref 44–121)
BUN/Creatinine Ratio: 16 (ref 9–20)
BUN: 16 mg/dL (ref 6–24)
Bilirubin Total: 0.7 mg/dL (ref 0.0–1.2)
CO2: 22 mmol/L (ref 20–29)
Calcium: 9.1 mg/dL (ref 8.7–10.2)
Chloride: 100 mmol/L (ref 96–106)
Creatinine, Ser: 0.97 mg/dL (ref 0.76–1.27)
Globulin, Total: 2.3 g/dL (ref 1.5–4.5)
Glucose: 146 mg/dL — ABNORMAL HIGH (ref 65–99)
Potassium: 4.3 mmol/L (ref 3.5–5.2)
Sodium: 138 mmol/L (ref 134–144)
Total Protein: 6.9 g/dL (ref 6.0–8.5)
eGFR: 98 mL/min/{1.73_m2} (ref >59)

## 2020-07-09 NOTE — Addendum Note (Signed)
Addended by: Jonah Blue B on: 07/09/2020 10:57 AM   Modules accepted: Orders

## 2020-07-11 ENCOUNTER — Encounter: Payer: Self-pay | Admitting: Internal Medicine

## 2020-07-11 LAB — SPECIMEN STATUS REPORT

## 2020-07-11 LAB — HEMOGLOBIN A1C
Est. average glucose Bld gHb Est-mCnc: 117 mg/dL
Hgb A1c MFr Bld: 5.7 % — ABNORMAL HIGH (ref 4.8–5.6)

## 2020-07-11 LAB — IRON,TIBC AND FERRITIN PANEL
Ferritin: 441 ng/mL — ABNORMAL HIGH (ref 30–400)
Iron Saturation: 30 % (ref 15–55)
Iron: 74 ug/dL (ref 38–169)
Total Iron Binding Capacity: 245 ug/dL — ABNORMAL LOW (ref 250–450)
UIBC: 171 ug/dL (ref 111–343)

## 2020-07-11 LAB — FOLATE: Folate: 5.4 ng/mL (ref >3.0)

## 2020-07-11 LAB — VITAMIN B12: Vitamin B-12: 257 pg/mL (ref 232–1245)

## 2020-07-18 ENCOUNTER — Other Ambulatory Visit: Payer: Self-pay | Admitting: Internal Medicine

## 2020-07-18 ENCOUNTER — Other Ambulatory Visit: Payer: Self-pay

## 2020-07-18 DIAGNOSIS — I10 Essential (primary) hypertension: Secondary | ICD-10-CM

## 2020-07-18 MED ORDER — LISINOPRIL 20 MG PO TABS
ORAL_TABLET | Freq: Every day | ORAL | 0 refills | Status: DC
  Filled 2020-07-18 – 2020-07-25 (×2): qty 30, 30d supply, fill #0

## 2020-07-21 ENCOUNTER — Other Ambulatory Visit: Payer: Self-pay

## 2020-07-22 ENCOUNTER — Encounter: Payer: Self-pay | Admitting: Internal Medicine

## 2020-07-22 ENCOUNTER — Other Ambulatory Visit: Payer: Self-pay

## 2020-07-25 ENCOUNTER — Other Ambulatory Visit: Payer: Self-pay

## 2020-08-04 ENCOUNTER — Other Ambulatory Visit: Payer: Self-pay

## 2020-08-07 ENCOUNTER — Other Ambulatory Visit: Payer: Self-pay

## 2020-08-11 ENCOUNTER — Other Ambulatory Visit: Payer: Self-pay

## 2020-08-20 ENCOUNTER — Other Ambulatory Visit: Payer: Self-pay

## 2020-08-20 ENCOUNTER — Other Ambulatory Visit: Payer: Self-pay | Admitting: Internal Medicine

## 2020-08-20 DIAGNOSIS — I10 Essential (primary) hypertension: Secondary | ICD-10-CM

## 2020-08-20 DIAGNOSIS — I6930 Unspecified sequelae of cerebral infarction: Secondary | ICD-10-CM

## 2020-08-20 MED ORDER — ATORVASTATIN CALCIUM 80 MG PO TABS
ORAL_TABLET | Freq: Every day | ORAL | 0 refills | Status: DC
  Filled 2020-08-20: qty 90, 90d supply, fill #0

## 2020-08-20 MED ORDER — LISINOPRIL 20 MG PO TABS
ORAL_TABLET | Freq: Every day | ORAL | 0 refills | Status: DC
  Filled 2020-08-20: qty 90, 90d supply, fill #0

## 2020-08-20 NOTE — Telephone Encounter (Signed)
Future OV 11/07/20, LOV 07/08/20  Approved per protocol. Provided enough medication until next appointment. Requested Prescriptions  Pending Prescriptions Disp Refills  . atorvastatin (LIPITOR) 80 MG tablet 30 tablet 0    Sig: TAKE 1 TABLET (80 MG TOTAL) BY MOUTH DAILY.     Cardiovascular:  Antilipid - Statins Passed - 08/20/2020  2:28 PM      Passed - Total Cholesterol in normal range and within 360 days    Cholesterol, Total  Date Value Ref Range Status  07/08/2020 143 100 - 199 mg/dL Final         Passed - LDL in normal range and within 360 days    LDL Chol Calc (NIH)  Date Value Ref Range Status  07/08/2020 57 0 - 99 mg/dL Final         Passed - HDL in normal range and within 360 days    HDL  Date Value Ref Range Status  07/08/2020 70 >39 mg/dL Final         Passed - Triglycerides in normal range and within 360 days    Triglycerides  Date Value Ref Range Status  07/08/2020 85 0 - 149 mg/dL Final         Passed - Patient is not pregnant      Passed - Valid encounter within last 12 months    Recent Outpatient Visits          1 month ago Essential hypertension   [facility] Marcine Matar, MD   8 months ago Essential hypertension   [facility] Marcine Matar, MD   1 year ago Need for Tdap vaccination   [facility] Lois Huxley, Cornelius Moras, RPH-CPP   1 year ago Essential hypertension   [facility] Marcine Matar, MD   1 year ago Hospital discharge follow-up   [facility] Marcine Matar, MD      Future Appointments            In 2 months Laural Benes Binnie Rail, MD [facility]           . lisinopril (ZESTRIL) 20 MG tablet 90 tablet 0    Sig: Take 1 tablet by mouth daily.     Cardiovascular:  ACE Inhibitors Passed - 08/20/2020  2:28 PM      Passed - Cr in normal range and  within 180 days    Creatinine, Ser  Date Value Ref Range Status  07/08/2020 0.97 0.76 - 1.27 mg/dL Final         Passed - K in normal range and within 180 days    Potassium  Date Value Ref Range Status  07/08/2020 4.3 3.5 - 5.2 mmol/L Final         Passed - Patient is not pregnant      Passed - Last BP in normal range    BP Readings from Last 1 Encounters:  07/08/20 102/66         Passed - Valid encounter within last 6 months    Recent Outpatient Visits          1 month ago Essential hypertension   [facility] Marcine Matar, MD   8 months ago Essential hypertension   [facility] Marcine Matar, MD   1 year ago Need for  Tdap vaccination   [facility] Lois Huxley, Cornelius Moras, RPH-CPP   1 year ago Essential hypertension   [facility] Marcine Matar, MD   1 year ago Hospital discharge follow-up   [facility] Marcine Matar, MD      Future Appointments            In 2 months Laural Benes Binnie Rail, MD [facility]

## 2020-08-22 ENCOUNTER — Encounter: Payer: Self-pay | Admitting: Internal Medicine

## 2020-08-22 ENCOUNTER — Other Ambulatory Visit: Payer: Self-pay

## 2020-08-22 DIAGNOSIS — I10 Essential (primary) hypertension: Secondary | ICD-10-CM

## 2020-08-23 MED ORDER — CARVEDILOL 6.25 MG PO TABS: 3.1250 mg | ORAL_TABLET | Freq: Two times a day (BID) | ORAL | 4 refills | Status: DC

## 2020-08-31 ENCOUNTER — Encounter: Payer: Self-pay | Admitting: Internal Medicine

## 2020-09-01 ENCOUNTER — Other Ambulatory Visit: Payer: Self-pay

## 2020-09-04 ENCOUNTER — Other Ambulatory Visit: Payer: Self-pay

## 2020-09-04 ENCOUNTER — Other Ambulatory Visit: Payer: Self-pay | Admitting: Pharmacist

## 2020-09-04 DIAGNOSIS — I10 Essential (primary) hypertension: Secondary | ICD-10-CM

## 2020-09-04 MED ORDER — CARVEDILOL 6.25 MG PO TABS
3.1250 mg | ORAL_TABLET | Freq: Two times a day (BID) | ORAL | 4 refills | Status: DC
  Filled 2020-09-04: qty 90, 90d supply, fill #0
  Filled 2020-09-23 – 2020-10-08 (×2): qty 60, 60d supply, fill #0

## 2020-09-11 ENCOUNTER — Other Ambulatory Visit: Payer: Self-pay

## 2020-09-23 ENCOUNTER — Other Ambulatory Visit: Payer: Self-pay

## 2020-09-30 ENCOUNTER — Other Ambulatory Visit: Payer: Self-pay

## 2020-10-08 ENCOUNTER — Other Ambulatory Visit: Payer: Self-pay

## 2020-10-09 ENCOUNTER — Other Ambulatory Visit: Payer: Self-pay

## 2020-11-07 ENCOUNTER — Ambulatory Visit: Payer: Medicaid Other | Admitting: Internal Medicine

## 2020-11-10 ENCOUNTER — Other Ambulatory Visit: Payer: Self-pay | Admitting: Internal Medicine

## 2020-11-10 DIAGNOSIS — F39 Unspecified mood [affective] disorder: Secondary | ICD-10-CM

## 2020-11-10 DIAGNOSIS — I10 Essential (primary) hypertension: Secondary | ICD-10-CM

## 2020-11-10 MED ORDER — SERTRALINE HCL 50 MG PO TABS
ORAL_TABLET | ORAL | 1 refills | Status: DC
  Filled 2020-11-10: qty 15, 30d supply, fill #0

## 2020-11-10 MED ORDER — AMLODIPINE BESYLATE 10 MG PO TABS
ORAL_TABLET | Freq: Every day | ORAL | 0 refills | Status: DC
  Filled 2020-11-10: qty 30, 30d supply, fill #0

## 2020-11-11 ENCOUNTER — Other Ambulatory Visit: Payer: Self-pay

## 2020-11-14 ENCOUNTER — Other Ambulatory Visit: Payer: Self-pay

## 2020-11-14 ENCOUNTER — Encounter: Payer: Self-pay | Admitting: Internal Medicine

## 2020-11-14 DIAGNOSIS — I10 Essential (primary) hypertension: Secondary | ICD-10-CM

## 2020-11-14 DIAGNOSIS — F39 Unspecified mood [affective] disorder: Secondary | ICD-10-CM

## 2020-11-14 MED ORDER — SERTRALINE HCL 50 MG PO TABS: ORAL_TABLET | ORAL | 0 refills | Status: DC

## 2020-11-14 MED ORDER — AMLODIPINE BESYLATE 10 MG PO TABS: ORAL_TABLET | Freq: Every day | ORAL | 0 refills | Status: DC

## 2020-11-17 ENCOUNTER — Ambulatory Visit: Payer: Medicaid Other | Attending: Internal Medicine | Admitting: Internal Medicine

## 2020-11-17 ENCOUNTER — Other Ambulatory Visit: Payer: Self-pay

## 2020-11-17 ENCOUNTER — Encounter: Payer: Self-pay | Admitting: Internal Medicine

## 2020-11-17 VITALS — BP 104/60 | HR 70 | Resp 16 | Wt 175.2 lb

## 2020-11-17 DIAGNOSIS — M25661 Stiffness of right knee, not elsewhere classified: Secondary | ICD-10-CM

## 2020-11-17 DIAGNOSIS — Z713 Dietary counseling and surveillance: Secondary | ICD-10-CM | POA: Insufficient documentation

## 2020-11-17 DIAGNOSIS — M25669 Stiffness of unspecified knee, not elsewhere classified: Secondary | ICD-10-CM | POA: Diagnosis not present

## 2020-11-17 DIAGNOSIS — Z1159 Encounter for screening for other viral diseases: Secondary | ICD-10-CM | POA: Diagnosis not present

## 2020-11-17 DIAGNOSIS — F39 Unspecified mood [affective] disorder: Secondary | ICD-10-CM | POA: Insufficient documentation

## 2020-11-17 DIAGNOSIS — Z8249 Family history of ischemic heart disease and other diseases of the circulatory system: Secondary | ICD-10-CM | POA: Diagnosis not present

## 2020-11-17 DIAGNOSIS — R7303 Prediabetes: Secondary | ICD-10-CM | POA: Diagnosis not present

## 2020-11-17 DIAGNOSIS — Z6825 Body mass index (BMI) 25.0-25.9, adult: Secondary | ICD-10-CM | POA: Diagnosis not present

## 2020-11-17 DIAGNOSIS — Z8673 Personal history of transient ischemic attack (TIA), and cerebral infarction without residual deficits: Secondary | ICD-10-CM | POA: Insufficient documentation

## 2020-11-17 DIAGNOSIS — E538 Deficiency of other specified B group vitamins: Secondary | ICD-10-CM | POA: Insufficient documentation

## 2020-11-17 DIAGNOSIS — Z560 Unemployment, unspecified: Secondary | ICD-10-CM | POA: Diagnosis not present

## 2020-11-17 DIAGNOSIS — E663 Overweight: Secondary | ICD-10-CM | POA: Diagnosis not present

## 2020-11-17 DIAGNOSIS — Z79899 Other long term (current) drug therapy: Secondary | ICD-10-CM | POA: Insufficient documentation

## 2020-11-17 DIAGNOSIS — Z23 Encounter for immunization: Secondary | ICD-10-CM | POA: Insufficient documentation

## 2020-11-17 DIAGNOSIS — Z7982 Long term (current) use of aspirin: Secondary | ICD-10-CM | POA: Insufficient documentation

## 2020-11-17 DIAGNOSIS — M79604 Pain in right leg: Secondary | ICD-10-CM | POA: Insufficient documentation

## 2020-11-17 DIAGNOSIS — B360 Pityriasis versicolor: Secondary | ICD-10-CM | POA: Diagnosis not present

## 2020-11-17 DIAGNOSIS — F17210 Nicotine dependence, cigarettes, uncomplicated: Secondary | ICD-10-CM | POA: Diagnosis not present

## 2020-11-17 DIAGNOSIS — I10 Essential (primary) hypertension: Secondary | ICD-10-CM | POA: Diagnosis not present

## 2020-11-17 DIAGNOSIS — M546 Pain in thoracic spine: Secondary | ICD-10-CM | POA: Insufficient documentation

## 2020-11-17 MED ORDER — SERTRALINE HCL 50 MG PO TABS: 50.0000 mg | ORAL_TABLET | Freq: Every day | ORAL | 3 refills | Status: DC

## 2020-11-17 MED ORDER — KETOCONAZOLE 2 % EX CREA: 1.0000 "application " | TOPICAL_CREAM | Freq: Every day | CUTANEOUS | 0 refills | Status: DC

## 2020-11-17 MED ORDER — LISINOPRIL 20 MG PO TABS: ORAL_TABLET | Freq: Every day | ORAL | 1 refills | Status: DC

## 2020-11-17 MED ORDER — CARVEDILOL 6.25 MG PO TABS: 3.1250 mg | ORAL_TABLET | Freq: Two times a day (BID) | ORAL | 1 refills | Status: DC

## 2020-11-17 MED ORDER — AMLODIPINE BESYLATE 10 MG PO TABS: ORAL_TABLET | Freq: Every day | ORAL | 1 refills | Status: DC

## 2020-11-17 MED ORDER — ATORVASTATIN CALCIUM 80 MG PO TABS: ORAL_TABLET | Freq: Every day | ORAL | 1 refills | Status: DC

## 2020-11-17 MED ORDER — DICLOFENAC SODIUM 1 % EX GEL: 2.0000 g | Freq: Four times a day (QID) | CUTANEOUS | 1 refills | Status: DC

## 2020-11-17 NOTE — Progress Notes (Signed)
Pt states his right leg is stiff

## 2020-11-17 NOTE — Progress Notes (Signed)
Patient ID: Victor Davies, male    DOB: Jul 09, 1975  MRN: [id number]  CC: Hypertension   Subjective: Victor Davies is a 45 y.o. male who presents for chronic ds management.  Wife is with him. His concerns today include:  Patient with history of HTN, HL, former tob dep, CVA., marijuana use, ETOH use disorder early remission  Anemia: On last visit he was found to be anemic with B12 deficiency.  I recommend that he started taking B12 1000 mcg over-the-counter.  His wife states that he has been taking it every day.  I also referred him to the gastroenterologist for colon cancer screening.  They were called last week.  Wife states she tried to call them back but did not get an answer.  She will follow-up trying to call the gastroenterologist again today to get him scheduled.    Mood swings: taking Zoloft.  Just started the full 50 mg yesterday.  Wife feels it is working but still has a temper here and there.  HTN: we decrease dose of Coreg on last visit to 3.125 mg twice a day.  Reports compliance with carvedilol, amlodipine and lisinopril.  Checking BP once a wk with good readings.  Last reading 117/71 He has gained 18 pounds since last visit in June.  He has been eating a little bit more.  He stays active taking care of his kids.  A1C was in the range for preDM on last visit  C/o some stiffness RT leg and pain RT upper back.  No initiating factors.  C/o rash around the neck that has been there for mths but seems to be spreading.  Does not itch.  HM: got COVID booster and flu shot at [facility] on 10/23/2020.  Due for Pneumonia vaccine.   Patient Active Problem List   Diagnosis Date Noted   Mood disorder (HCC) 07/08/2020   Insomnia 07/08/2020   History of CVA (cerebrovascular accident)    Hypertension    Former tobacco use 08/06/2019   History of CVA with residual deficit 06/05/2019   Acute ischemic stroke (HCC)    Alcohol use disorder, severe, in early remission (HCC)     Hypokalemia    Marijuana abuse    Acute CVA (cerebrovascular accident) (HCC) 05/17/2019   Essential hypertension 05/10/2019   CVA, old, alterations of sensations 05/10/2019   Opacity of lung on imaging study 05/10/2019     Current Outpatient Medications on File Prior to Visit  Medication Sig Dispense Refill   aspirin EC 325 MG EC tablet Take 1 tablet (325 mg total) by mouth daily. 30 tablet 0   nicotine polacrilex (NICORETTE) 2 MG gum Take 1 each (2 mg total) by mouth as needed for smoking cessation. Max 30 pieces/day (Patient not taking: Reported on 11/17/2020) 100 tablet 3   No current facility-administered medications on file prior to visit.    No Known Allergies  Social History   Socioeconomic History   Marital status: Married    Spouse name: [name]    Number of children: 2   Years of education: Not on file   Highest education level: 9th grade  Occupational History   Occupation: unemployed  Tobacco Use   Smoking status: Some Days    Packs/day: 0.25    Years: 20.00    Pack years: 5.00    Types: Cigarettes   Smokeless tobacco: Never  Vaping Use   Vaping Use: Never used  Substance and Sexual Activity   Alcohol use: Yes  Alcohol/week: 14.0 - 21.0 standard drinks    Types: 14 - 21 Cans of beer per week   Drug use: Not on file   Sexual activity: Not on file  Other Topics Concern   Not on file  Social History Narrative   Not on file   Social Determinants of Health   Financial Resource Strain: Not on file  Food Insecurity: Not on file  Transportation Needs: Not on file  Physical Activity: Not on file  Stress: Not on file  Social Connections: Not on file  Intimate Partner Violence: Not on file    Family History  Problem Relation Age of Onset   Hypertension Father    Arthritis Mother     No past surgical history on file.  ROS: Review of Systems Negative except as stated above  PHYSICAL EXAM: BP 104/60   Pulse 70   Resp 16   Wt 175 lb 3.2 oz (79.5  kg)   SpO2 98%   BMI 25.87 kg/m   Wt Readings from Last 3 Encounters:  11/17/20 175 lb 3.2 oz (79.5 kg)  07/08/20 157 lb 9.6 oz (71.5 kg)  08/06/19 163 lb 9.6 oz (74.2 kg)    Physical Exam   General appearance - alert, well appearing, and in no distress Mental status -patient is quiet with flat affect.  He answers questions appropriate Neck - supple, no significant adenopathy Chest - clear to auscultation, no wheezes, rales or rhonchi, symmetric air entry Heart - normal rate, regular rhythm, normal S1, S2, no murmurs, rubs, clicks or gallops Musculoskeletal -right leg: He has no edema or joint enlargement of the right knee.  He has good range of motion of the right knee and hip.  No tenderness on palpation of the thoracic spine or surrounding paraspinal muscles. Extremities - peripheral pulses normal, no pedal edema, no clubbing or cyanosis Skin:  hypopigmented rash around neck more so posterior aspect CMP Latest Ref Rng & Units 07/08/2020 05/19/2019 05/18/2019  Glucose 65 - 99 mg/dL 627(O) 97 97  BUN 6 - 24 mg/dL 16 11 8   Creatinine 0.76 - 1.27 mg/dL 3.50 0.93  Sodium 134 - 144 mmol/L 138 140 139  Potassium 3.5 - 5.2 mmol/L 4.3 3.9 3.4(L)  Chloride 96 - 106 mmol/L 100 106 103  CO2 20 - 29 mmol/L 22 26 25   Calcium 8.7 - 10.2 mg/dL 9.1 9.3 9.2  Total Protein 6.0 - 8.5 g/dL 6.9 - -  Total Bilirubin 0.0 - 1.2 mg/dL 0.7 - -  Alkaline Phos 44 - 121 IU/L 72 - -  AST 0 - 40 IU/L 15 - -  ALT 0 - 44 IU/L 11 - -   Lipid Panel     Component Value Date/Time   CHOL 143 07/08/2020 1621   TRIG 85 07/08/2020 1621   HDL 70 07/08/2020 1621   CHOLHDL 2.0 07/08/2020 1621   CHOLHDL 2.8 05/17/2019 0101   VLDL 5 05/17/2019 0101   LDLCALC 57 07/08/2020 1621    CBC    Component Value Date/Time   WBC 9.4 07/08/2020 1621   WBC 7.9 05/19/2019 0435   RBC 3.99 (L) 07/08/2020 1621   RBC 5.47 05/19/2019 0435   HGB 11.8 (L) 07/08/2020 1621   HCT 36.1 (L) 07/08/2020 1621   PLT 296 07/08/2020  1621   MCV 91 07/08/2020 1621   MCH 29.6 07/08/2020 1621   MCH 30.5 05/19/2019 0435   MCHC 32.7 07/08/2020 1621   MCHC 33.5 05/19/2019 0435  RDW 12.7 07/08/2020 1621   LYMPHSABS 2.5 05/17/2019 0617   MONOABS 0.6 05/17/2019 0617   EOSABS 0.2 05/17/2019 0617   BASOSABS 0.0 05/17/2019 0617    ASSESSMENT AND PLAN: 1. Essential hypertension At goal.  Contiue current meds - carvedilol (COREG) 6.25 MG tablet; Take 0.5 tablets (3.125 mg total) by mouth 2 (two) times daily with a meal.  Dispense: 180 tablet; Refill: 1 - amLODipine (NORVASC) 10 MG tablet; TAKE 1 TABLET (10 MG TOTAL) BY MOUTH DAILY.  Dispense: 90 tablet; Refill: 1 - lisinopril (ZESTRIL) 20 MG tablet; Take 1 tablet by mouth daily.  Dispense: 90 tablet; Refill: 1  2. Mood disorder (HCC) Improved.  Continue Zoloft - sertraline (ZOLOFT) 50 MG tablet; Take 1 tablet (50 mg total) by mouth daily. TAKE 1/2 tablet by mouth daily x 1 month then 1 tablet daily.  Dispense: 30 tablet; Refill: 3  3. Vitamin B12 deficiency Continue Vit B12 1000 mcg OTC - CBC - Vitamin B12  4. Overweight (BMI 25.0-29.9) Dietary counseling given.  Advised to eliminate sugary drinks from the diet, cut back on portion sizes of white carbohydrates, eat more white lean meat instead of red meat and incorporate fresh fruits and vegetables into the diet.  Encouraged him to stay active. - Hemoglobin A1c  5. Prediabetes See #4 above - Hemoglobin A1c  6. Tinea versicolor - ketoconazole (NIZORAL) 2 % cream; Apply 1 application topically daily. Apply to rash around neck  Dispense: 15 g; Refill: 0  7. Joint stiffness of right lower leg - diclofenac Sodium (VOLTAREN) 1 % GEL; Apply 2 g topically 4 (four) times daily. For knee stiffness  Dispense: 100 g; Refill: 1  8. Acute right-sided thoracic back pain - diclofenac Sodium (VOLTAREN) 1 % GEL; Apply 2 g topically 4 (four) times daily. For knee stiffness  Dispense: 100 g; Refill: 1  9. Need for hepatitis C  screening test - HCV Ab w Reflex to Quant PCR  10. Need for vaccination against Streptococcus pneumoniae - Pneumococcal conjugate vaccine 20-valent    Patient was given the opportunity to ask questions.  Patient verbalized understanding of the plan and was able to repeat key elements of the plan.   Orders Placed This Encounter  Procedures   Pneumococcal conjugate vaccine 20-valent   HCV Ab w Reflex to Quant PCR   CBC   Vitamin B12   Hemoglobin A1c     Requested Prescriptions   Signed Prescriptions Disp Refills   diclofenac Sodium (VOLTAREN) 1 % GEL 100 g 1    Sig: Apply 2 g topically 4 (four) times daily. For knee stiffness   sertraline (ZOLOFT) 50 MG tablet 30 tablet 3    Sig: Take 1 tablet (50 mg total) by mouth daily. TAKE 1/2 tablet by mouth daily x 1 month then 1 tablet daily.   carvedilol (COREG) 6.25 MG tablet 180 tablet 1    Sig: Take 0.5 tablets (3.125 mg total) by mouth 2 (two) times daily with a meal.   amLODipine (NORVASC) 10 MG tablet 90 tablet 1    Sig: TAKE 1 TABLET (10 MG TOTAL) BY MOUTH DAILY.   atorvastatin (LIPITOR) 80 MG tablet 90 tablet 1    Sig: TAKE 1 TABLET (80 MG TOTAL) BY MOUTH DAILY.   lisinopril (ZESTRIL) 20 MG tablet 90 tablet 1    Sig: Take 1 tablet by mouth daily.   ketoconazole (NIZORAL) 2 % cream 15 g 0    Sig: Apply 1 application topically daily. Apply to  rash around neck    Return in about 4 months (around 03/20/2021).  Jonah Blue, MD, FACP

## 2020-11-17 NOTE — Patient Instructions (Signed)

## 2020-11-18 LAB — CBC
Hematocrit: 39.1 % (ref 37.5–51.0)
Hemoglobin: 12.8 g/dL — ABNORMAL LOW (ref 13.0–17.7)
MCH: 29.1 pg (ref 26.6–33.0)
MCHC: 32.7 g/dL (ref 31.5–35.7)
MCV: 89 fL (ref 79–97)
Platelets: 308 10*3/uL (ref 150–450)
RBC: 4.4 x10E6/uL (ref 4.14–5.80)
RDW: 11.8 % (ref 11.6–15.4)
WBC: 8.6 10*3/uL (ref 3.4–10.8)

## 2020-11-18 LAB — HCV AB W REFLEX TO QUANT PCR: HCV Ab: <0.1 s/co ratio (ref 0.0–0.9)

## 2020-11-18 LAB — VITAMIN B12: Vitamin B-12: 739 pg/mL (ref 232–1245)

## 2020-11-18 LAB — HCV INTERPRETATION

## 2020-11-18 LAB — HEMOGLOBIN A1C
Est. average glucose Bld gHb Est-mCnc: 108 mg/dL
Hgb A1c MFr Bld: 5.4 % (ref 4.8–5.6)

## 2020-12-15 ENCOUNTER — Encounter: Payer: Self-pay | Admitting: Internal Medicine

## 2020-12-25 ENCOUNTER — Other Ambulatory Visit: Payer: Self-pay | Admitting: Internal Medicine

## 2020-12-25 DIAGNOSIS — F39 Unspecified mood [affective] disorder: Secondary | ICD-10-CM

## 2020-12-25 MED ORDER — SERTRALINE HCL 50 MG PO TABS: 50.0000 mg | ORAL_TABLET | Freq: Every day | ORAL | 6 refills | Status: DC

## 2021-01-20 ENCOUNTER — Encounter: Payer: Self-pay | Admitting: Internal Medicine

## 2021-01-26 ENCOUNTER — Encounter: Payer: Self-pay | Admitting: Internal Medicine

## 2021-02-17 ENCOUNTER — Ambulatory Visit: Payer: Medicaid Other | Attending: Internal Medicine | Admitting: Internal Medicine

## 2021-02-17 DIAGNOSIS — F39 Unspecified mood [affective] disorder: Secondary | ICD-10-CM | POA: Diagnosis not present

## 2021-02-17 DIAGNOSIS — I10 Essential (primary) hypertension: Secondary | ICD-10-CM

## 2021-02-17 DIAGNOSIS — E538 Deficiency of other specified B group vitamins: Secondary | ICD-10-CM

## 2021-02-17 DIAGNOSIS — Z87891 Personal history of nicotine dependence: Secondary | ICD-10-CM | POA: Diagnosis not present

## 2021-02-17 DIAGNOSIS — Z1211 Encounter for screening for malignant neoplasm of colon: Secondary | ICD-10-CM

## 2021-02-17 NOTE — Progress Notes (Signed)
Patient ID: Victor Davies, male   DOB: Jul 21, 1975, 46 y.o.   MRN: [id number] Virtual Visit via Telephone Note  I connected with Victor Davies on 02/17/2021 at 3:44 p.m by telephone and verified that I am speaking with the correct person using two identifiers  Location: Patient: home Provider: office  Participants: Myself Patient   I discussed the limitations, risks, security and privacy concerns of performing an evaluation and management service by telephone and the availability of in person appointments. I also discussed with the patient that there may be a patient responsible charge related to this service. The patient expressed understanding and agreed to proceed.   History of Present Illness: Patient with history of HTN, HL, former tob dep, CVA., marijuana use, ETOH use disorder early remission.  Last evaluated 10/2020.  Today's visit is for chronic disease management.  Tob dep:  quit smoking about 1 yr ago.  Still get cravings at time.  His wife had sent me a MyChart message inquiring about whether he can use e-cigarettes to help with the craving.  I suggested using the patches instead.  Patient confirms today that he does have the patches at home and that he uses them whenever he feels the cravings.    HTN:  been a while since BP check.  Reports compliance with meds which include carvedilol, amlodipine and lisinopril. Limits salt in foods. No CP/SOB  Mood swings:  reports stable on Zoloft  Anemia: still taking B12 supplement.  H/H had improved on last visit to 12.8.  [facility] tried calling him again in November to set up appointment for colonoscopy but does not look as though the phone call was returned.  Patient is agreeable for me to resubmit the referral.    Outpatient Encounter Medications as of 02/17/2021  Medication Sig   amLODipine (NORVASC) 10 MG tablet TAKE 1 TABLET (10 MG TOTAL) BY MOUTH DAILY.   aspirin EC 325 MG EC tablet Take 1 tablet (325 mg  total) by mouth daily.   atorvastatin (LIPITOR) 80 MG tablet TAKE 1 TABLET (80 MG TOTAL) BY MOUTH DAILY.   carvedilol (COREG) 6.25 MG tablet Take 0.5 tablets (3.125 mg total) by mouth 2 (two) times daily with a meal.   diclofenac Sodium (VOLTAREN) 1 % GEL Apply 2 g topically 4 (four) times daily. For knee stiffness   ketoconazole (NIZORAL) 2 % cream Apply 1 application topically daily. Apply to rash around neck   lisinopril (ZESTRIL) 20 MG tablet Take 1 tablet by mouth daily.   nicotine polacrilex (NICORETTE) 2 MG gum Take 1 each (2 mg total) by mouth as needed for smoking cessation. Max 30 pieces/day (Patient not taking: Reported on 11/17/2020)   sertraline (ZOLOFT) 50 MG tablet Take 1 tablet (50 mg total) by mouth daily.   No facility-administered encounter medications on file as of 02/17/2021.      Observations/Objective: No direct observation done as this was a telephone encounter.  Assessment and Plan: 1. Essential hypertension Encourage patient to check his blood pressure at least once or twice a week with goal being 130/80 or lower.  Continue amlodipine, carvedilol and lisinopril.  2. Vitamin B12 deficiency Continue B12 supplement which his wife purchases over-the-counter.  3. Mood disorder ([place]) Stable on Zoloft.  4. Former smoker Courage him to use the nicotine patches whenever he gets cravings for cigarettes.  5. Screening for colon cancer - Ambulatory referral to Gastroenterology   Follow Up Instructions: 4 mths   I discussed the assessment and treatment  plan with the patient. The patient was provided an opportunity to ask questions and all were answered. The patient agreed with the plan and demonstrated an understanding of the instructions.   The patient was advised to call back or seek an in-person evaluation if the symptoms worsen or if the condition fails to improve as anticipated.  I  Spent 6 minutes on this telephone encounter  This note has been created with  Surveyor, quantity. Any transcriptional errors are unintentional.  Karle Plumber, MD

## 2021-05-31 ENCOUNTER — Encounter: Payer: Self-pay | Admitting: Internal Medicine

## 2021-05-31 ENCOUNTER — Other Ambulatory Visit: Payer: Self-pay | Admitting: Internal Medicine

## 2021-05-31 DIAGNOSIS — I10 Essential (primary) hypertension: Secondary | ICD-10-CM

## 2021-05-31 MED ORDER — CARVEDILOL 6.25 MG PO TABS: 3.1250 mg | ORAL_TABLET | Freq: Two times a day (BID) | ORAL | 1 refills | Status: DC

## 2021-05-31 MED ORDER — LISINOPRIL 20 MG PO TABS: ORAL_TABLET | Freq: Every day | ORAL | 1 refills | Status: DC

## 2021-05-31 MED ORDER — ATORVASTATIN CALCIUM 80 MG PO TABS
ORAL_TABLET | Freq: Every day | ORAL | 1 refills | Status: DC
Start: 2021-05-31 — End: 2021-12-14

## 2021-05-31 MED ORDER — AMLODIPINE BESYLATE 10 MG PO TABS: ORAL_TABLET | Freq: Every day | ORAL | 1 refills | Status: DC

## 2021-06-01 NOTE — Telephone Encounter (Signed)
Duplicate request- all ordered 05/31/21 by provider ?Requested Prescriptions  ?Pending Prescriptions Disp Refills  ?? amLODipine (NORVASC) 10 MG tablet [Pharmacy Med Name: amLODIPine Besylate 10 MG Oral Tablet] 90 tablet 0  ?  Sig: Take 1 tablet by mouth once daily  ?  ? Cardiovascular: Calcium Channel Blockers 2 Passed - 05/31/2021 10:51 AM  ?  ?  Passed - Last BP in normal range  ?  BP Readings from Last 1 Encounters:  ?11/17/20 104/60  ?   ?  ?  Passed - Last Heart Rate in normal range  ?  Pulse Readings from Last 1 Encounters:  ?11/17/20 70  ?   ?  ?  Passed - Valid encounter within last 6 months  ?  Recent Outpatient Visits   ?      ? 3 months ago Essential hypertension  ? [place] Ladell Pier, MD  ? 6 months ago Essential hypertension  ? [place] Ladell Pier, MD  ? 10 months ago Essential hypertension  ? [place] Ladell Pier, MD  ? 1 year ago Essential hypertension  ? [place] Ladell Pier, MD  ? 1 year ago Need for Tdap vaccination  ? [place], RPH-CPP  ?  ?  ?Future Appointments   ?        ? In 2 weeks Ladell Pier, MD [place]  ?  ? ?  ?  ?  ?? atorvastatin (LIPITOR) 80 MG tablet [Pharmacy Med Name: Atorvastatin Calcium 80 MG Oral Tablet] 90 tablet 0  ?  Sig: Take 1 tablet by mouth once daily  ?  ? Cardiovascular:  Antilipid - Statins Failed - 05/31/2021 10:51 AM  ?  ?  Failed - Lipid Panel in normal range within the last 12 months  ?  Cholesterol, Total  ?Date Value Ref Range Status  ?07/08/2020 143 100 - 199 mg/dL Final  ? ?LDL Chol Calc (NIH)  ?Date Value Ref Range Status  ?07/08/2020 57 0 - 99 mg/dL Final  ? ?HDL  ?Date Value Ref Range Status  ?07/08/2020 70 >39 mg/dL Final  ? ?Triglycerides  ?Date Value Ref Range Status  ?07/08/2020 85 0 - 149 mg/dL Final   ? ?  ?  ?  Passed - Patient is not pregnant  ?  ?  Passed - Valid encounter within last 12 months  ?  Recent Outpatient Visits   ?      ? 3 months ago Essential hypertension  ? [place] Ladell Pier, MD  ? 6 months ago Essential hypertension  ? [place] Ladell Pier, MD  ? 10 months ago Essential hypertension  ? [place] Ladell Pier, MD  ? 1 year ago Essential hypertension  ? [place] Ladell Pier, MD  ? 1 year ago Need for Tdap vaccination  ? [place], RPH-CPP  ?  ?  ?Future Appointments   ?        ? In 2 weeks Ladell Pier, MD [facility]  ?  ? ?  ?  ?  ?? lisinopril (ZESTRIL) 20 MG tablet [Pharmacy Med Name: Lisinopril 20  MG Oral Tablet] 90 tablet 0  ?  Sig: Take 1 tablet by mouth once daily  ?  ? Cardiovascular:  ACE Inhibitors Failed - 05/31/2021 10:51 AM  ?  ?  Failed - Cr in normal range and within 180 days  ?  Creatinine, Ser  ?Date Value Ref Range Status  ?07/08/2020 0.97 0.76 - 1.27 mg/dL Final  ?   ?  ?  Failed - K in normal range and within 180 days  ?  Potassium  ?Date Value Ref Range Status  ?07/08/2020 4.3 3.5 - 5.2 mmol/L Final  ?   ?  ?  Passed - Patient is not pregnant  ?  ?  Passed - Last BP in normal range  ?  BP Readings from Last 1 Encounters:  ?11/17/20 104/60  ?   ?  ?  Passed - Valid encounter within last 6 months  ?  Recent Outpatient Visits   ?      ? 3 months ago Essential hypertension  ? [place] Ladell Pier, MD  ? 6 months ago Essential hypertension  ? [place] Ladell Pier, MD  ? 10 months ago Essential hypertension  ? [place] Ladell Pier, MD  ? 1 year ago Essential hypertension  ? [place]  Ladell Pier, MD  ? 1 year ago Need for Tdap vaccination  ? [place], RPH-CPP  ?  ?  ?Future Appointments   ?        ? In 2 weeks Ladell Pier, MD [place]  ?  ? ?  ?  ?  ? ?

## 2021-06-16 ENCOUNTER — Ambulatory Visit: Payer: Medicaid Other | Admitting: Internal Medicine

## 2021-06-17 ENCOUNTER — Encounter: Payer: Self-pay | Admitting: Internal Medicine

## 2021-06-18 ENCOUNTER — Ambulatory Visit: Payer: Medicaid Other | Admitting: Internal Medicine

## 2021-08-10 ENCOUNTER — Encounter: Payer: Self-pay | Admitting: Internal Medicine

## 2021-08-11 ENCOUNTER — Other Ambulatory Visit: Payer: Self-pay | Admitting: Internal Medicine

## 2021-08-11 DIAGNOSIS — F39 Unspecified mood [affective] disorder: Secondary | ICD-10-CM

## 2021-08-11 MED ORDER — SERTRALINE HCL 50 MG PO TABS: 50.0000 mg | ORAL_TABLET | Freq: Every day | ORAL | 6 refills | Status: DC

## 2021-09-22 ENCOUNTER — Ambulatory Visit: Payer: Medicaid Other | Admitting: Internal Medicine

## 2021-12-11 ENCOUNTER — Other Ambulatory Visit: Payer: Self-pay | Admitting: Internal Medicine

## 2021-12-11 DIAGNOSIS — I10 Essential (primary) hypertension: Secondary | ICD-10-CM

## 2021-12-11 MED ORDER — AMLODIPINE BESYLATE 10 MG PO TABS: 10.0000 mg | ORAL_TABLET | Freq: Every day | ORAL | 0 refills | Status: DC

## 2021-12-11 NOTE — Telephone Encounter (Signed)
Courtesy refill given until upcoming appointment.  Requested Prescriptions  Pending Prescriptions Disp Refills   amLODipine (NORVASC) 10 MG tablet 120 tablet 0    Sig: Take 1 tablet (10 mg total) by mouth daily. OFFICE VISIT NEEDED FOR ADDITIONAL REFILLS     Cardiovascular: Calcium Channel Blockers 2 Failed - 12/11/2021  9:43 AM      Failed - Valid encounter within last 6 months    Recent Outpatient Visits           9 months ago Essential hypertension   [facility] Marcine Matar, MD   1 year ago Essential hypertension   [place] Marcine Matar, MD   1 year ago Essential hypertension   [place] [facility] Marcine Matar, MD   2 years ago Essential hypertension   [place] [facility] Marcine Matar, MD   2 years ago Need for Tdap vaccination   [facility] Lois Huxley, Cornelius Moras, RPH-CPP       Future Appointments             In 3 months Marcine Matar, MD [facility]            Passed - Last BP in normal range    BP Readings from Last 1 Encounters:  11/17/20 104/60         Passed - Last Heart Rate in normal range    Pulse Readings from Last 1 Encounters:  11/17/20 70          atorvastatin (LIPITOR) 80 MG tablet 90 tablet 1    Sig: TAKE 1 TABLET (80 MG TOTAL) BY MOUTH DAILY.     Cardiovascular:  Antilipid - Statins Failed - 12/11/2021  9:43 AM      Failed - Lipid Panel in normal range within the last 12 months    Cholesterol, Total  Date Value Ref Range Status  07/08/2020 143 100 - 199 mg/dL Final   LDL Chol Calc (NIH)  Date Value Ref Range Status  07/08/2020 57 0 - 99 mg/dL Final   HDL  Date Value Ref Range Status  07/08/2020 70 >39 mg/dL Final   Triglycerides  Date Value Ref Range Status  07/08/2020 85 0 - 149 mg/dL Final         Passed - Patient is not pregnant       Passed - Valid encounter within last 12 months    Recent Outpatient Visits           9 months ago Essential hypertension   [facility] [facility] Marcine Matar, MD   1 year ago Essential hypertension   [place] [facility] Marcine Matar, MD   1 year ago Essential hypertension   [place] [facility] Marcine Matar, MD   2 years ago Essential hypertension   [place] [facility] Marcine Matar, MD   2 years ago Need for Tdap vaccination   [facility] Lois Huxley, Cornelius Moras, RPH-CPP       Future Appointments             In 3 months Laural Benes, Binnie Rail, MD [facility]

## 2021-12-11 NOTE — Telephone Encounter (Signed)
Requested medication (s) are due for refill today: Yes  Requested medication (s) are on the active medication list: Yes  Last refill:  05/31/21  Future visit scheduled: Yes  Notes to clinic: Unable to refill per protocol due to failed labs, no updated results.      Requested Prescriptions  Pending Prescriptions Disp Refills   atorvastatin (LIPITOR) 80 MG tablet 90 tablet 1    Sig: TAKE 1 TABLET (80 MG TOTAL) BY MOUTH DAILY.     Cardiovascular:  Antilipid - Statins Failed - 12/11/2021  9:43 AM      Failed - Lipid Panel in normal range within the last 12 months    Cholesterol, Total  Date Value Ref Range Status  07/08/2020 143 100 - 199 mg/dL Final   LDL Chol Calc (NIH)  Date Value Ref Range Status  07/08/2020 57 0 - 99 mg/dL Final   HDL  Date Value Ref Range Status  07/08/2020 70 >39 mg/dL Final   Triglycerides  Date Value Ref Range Status  07/08/2020 85 0 - 149 mg/dL Final         Passed - Patient is not pregnant      Passed - Valid encounter within last 12 months    Recent Outpatient Visits           9 months ago Essential hypertension   [place] [facility] Marcine Matar, MD   1 year ago Essential hypertension   [place] [facility] Marcine Matar, MD   1 year ago Essential hypertension   [place] [facility] Marcine Matar, MD   2 years ago Essential hypertension   [place] [facility] Marcine Matar, MD   2 years ago Need for Tdap vaccination   [facility] Drucilla Chalet, RPH-CPP       Future Appointments             In 3 months Marcine Matar, MD [facility]            Signed Prescriptions Disp Refills   amLODipine (NORVASC) 10 MG tablet 120 tablet 0    Sig: Take 1 tablet (10 mg total) by mouth daily. OFFICE VISIT NEEDED FOR ADDITIONAL REFILLS     Cardiovascular: Calcium  Channel Blockers 2 Failed - 12/11/2021  9:43 AM      Failed - Valid encounter within last 6 months    Recent Outpatient Visits           9 months ago Essential hypertension   [place] [facility] Marcine Matar, MD   1 year ago Essential hypertension   [place] [facility] Marcine Matar, MD   1 year ago Essential hypertension   [place] [facility] Marcine Matar, MD   2 years ago Essential hypertension   [place] [facility] Marcine Matar, MD   2 years ago Need for Tdap vaccination   [facility] Lois Huxley, Cornelius Moras, RPH-CPP       Future Appointments             In 3 months Marcine Matar, MD [facility]            Passed - Last BP in normal range    BP Readings from Last 1  Encounters:  11/17/20 104/60         Passed - Last Heart Rate in normal range    Pulse Readings from Last 1 Encounters:  11/17/20 70

## 2021-12-11 NOTE — Telephone Encounter (Signed)
Medication Refill - Medication: amLODipine (NORVASC) 10 MG tablet  atorvastatin (LIPITOR) 80 MG tablet  lisinopril (ZESTRIL) 20 MG tablet   Has the patient contacted their pharmacy? Yes.   Just made his appt Preferred Pharmacy (with phone number or street name): [facility] - [place], [place] - [address]  Patient have an upcoming appointment? Yes.    Agent: Please be advised that refills may take up to 3 business days. We ask that you follow-up with your pharmacy.

## 2021-12-13 ENCOUNTER — Encounter: Payer: Self-pay | Admitting: Internal Medicine

## 2021-12-14 ENCOUNTER — Other Ambulatory Visit: Payer: Self-pay | Admitting: Internal Medicine

## 2021-12-14 DIAGNOSIS — I10 Essential (primary) hypertension: Secondary | ICD-10-CM

## 2021-12-14 MED ORDER — ATORVASTATIN CALCIUM 80 MG PO TABS: ORAL_TABLET | Freq: Every day | ORAL | 1 refills | Status: DC

## 2021-12-14 MED ORDER — LISINOPRIL 20 MG PO TABS: ORAL_TABLET | Freq: Every day | ORAL | 1 refills | Status: DC

## 2022-03-14 ENCOUNTER — Encounter: Payer: Self-pay | Admitting: Internal Medicine

## 2022-03-14 ENCOUNTER — Other Ambulatory Visit: Payer: Self-pay | Admitting: Internal Medicine

## 2022-03-14 DIAGNOSIS — F39 Unspecified mood [affective] disorder: Secondary | ICD-10-CM

## 2022-03-14 DIAGNOSIS — I10 Essential (primary) hypertension: Secondary | ICD-10-CM

## 2022-03-14 MED ORDER — ATORVASTATIN CALCIUM 80 MG PO TABS: ORAL_TABLET | Freq: Every day | ORAL | 0 refills | Status: DC

## 2022-03-14 MED ORDER — CARVEDILOL 6.25 MG PO TABS: 3.1250 mg | ORAL_TABLET | Freq: Two times a day (BID) | ORAL | 0 refills | Status: DC

## 2022-03-14 MED ORDER — AMLODIPINE BESYLATE 10 MG PO TABS: 10.0000 mg | ORAL_TABLET | Freq: Every day | ORAL | 0 refills | Status: DC

## 2022-03-14 MED ORDER — LISINOPRIL 20 MG PO TABS: ORAL_TABLET | Freq: Every day | ORAL | 0 refills | Status: DC

## 2022-03-14 MED ORDER — SERTRALINE HCL 50 MG PO TABS: 50.0000 mg | ORAL_TABLET | Freq: Every day | ORAL | 0 refills | Status: DC

## 2022-03-24 ENCOUNTER — Encounter: Payer: Self-pay | Admitting: Internal Medicine

## 2022-03-24 DIAGNOSIS — Z8679 Personal history of other diseases of the circulatory system: Secondary | ICD-10-CM | POA: Insufficient documentation

## 2022-03-29 ENCOUNTER — Ambulatory Visit: Payer: Medicaid Other | Attending: Internal Medicine | Admitting: Internal Medicine

## 2022-03-29 ENCOUNTER — Encounter: Payer: Self-pay | Admitting: Internal Medicine

## 2022-03-29 ENCOUNTER — Encounter: Payer: Self-pay | Admitting: Gastroenterology

## 2022-03-29 VITALS — BP 105/71 | HR 74 | Temp 97.8°F | Ht 69.0 in | Wt 183.0 lb

## 2022-03-29 DIAGNOSIS — F17200 Nicotine dependence, unspecified, uncomplicated: Secondary | ICD-10-CM

## 2022-03-29 DIAGNOSIS — I639 Cerebral infarction, unspecified: Secondary | ICD-10-CM | POA: Insufficient documentation

## 2022-03-29 DIAGNOSIS — Z6379 Other stressful life events affecting family and household: Secondary | ICD-10-CM

## 2022-03-29 DIAGNOSIS — F39 Unspecified mood [affective] disorder: Secondary | ICD-10-CM

## 2022-03-29 DIAGNOSIS — E538 Deficiency of other specified B group vitamins: Secondary | ICD-10-CM

## 2022-03-29 DIAGNOSIS — Z713 Dietary counseling and surveillance: Secondary | ICD-10-CM | POA: Diagnosis not present

## 2022-03-29 DIAGNOSIS — F17210 Nicotine dependence, cigarettes, uncomplicated: Secondary | ICD-10-CM | POA: Diagnosis not present

## 2022-03-29 DIAGNOSIS — Z1211 Encounter for screening for malignant neoplasm of colon: Secondary | ICD-10-CM

## 2022-03-29 DIAGNOSIS — Z1159 Encounter for screening for other viral diseases: Secondary | ICD-10-CM

## 2022-03-29 DIAGNOSIS — Z23 Encounter for immunization: Secondary | ICD-10-CM | POA: Diagnosis not present

## 2022-03-29 DIAGNOSIS — I10 Essential (primary) hypertension: Secondary | ICD-10-CM | POA: Diagnosis not present

## 2022-03-29 DIAGNOSIS — M546 Pain in thoracic spine: Secondary | ICD-10-CM | POA: Diagnosis not present

## 2022-03-29 DIAGNOSIS — F1020 Alcohol dependence, uncomplicated: Secondary | ICD-10-CM

## 2022-03-29 DIAGNOSIS — M7918 Myalgia, other site: Secondary | ICD-10-CM | POA: Diagnosis not present

## 2022-03-29 MED ORDER — CARVEDILOL 6.25 MG PO TABS: 3.1250 mg | ORAL_TABLET | Freq: Two times a day (BID) | ORAL | 1 refills | Status: DC

## 2022-03-29 MED ORDER — AMLODIPINE BESYLATE 10 MG PO TABS: 10.0000 mg | ORAL_TABLET | Freq: Every day | ORAL | 0 refills | Status: DC

## 2022-03-29 MED ORDER — LISINOPRIL 20 MG PO TABS: ORAL_TABLET | Freq: Every day | ORAL | 1 refills | Status: DC

## 2022-03-29 MED ORDER — NALTREXONE HCL 50 MG PO TABS: 50.0000 mg | ORAL_TABLET | Freq: Every day | ORAL | 0 refills | Status: DC

## 2022-03-29 MED ORDER — ATORVASTATIN CALCIUM 80 MG PO TABS: ORAL_TABLET | Freq: Every day | ORAL | 1 refills | Status: DC

## 2022-03-29 MED ORDER — SERTRALINE HCL 50 MG PO TABS: 50.0000 mg | ORAL_TABLET | Freq: Every day | ORAL | 1 refills | Status: DC

## 2022-03-29 MED ORDER — DICLOFENAC SODIUM 1 % EX GEL: 2.0000 g | Freq: Two times a day (BID) | CUTANEOUS | 1 refills | Status: AC | PRN

## 2022-03-29 NOTE — Progress Notes (Signed)
Patient ID: Victor Davies, male    DOB: 14-Oct-1975  MRN: [id number]  CC: Hypertension (HTN f/u /Upper back pain X1 /Pt diagnosed with "aspiration pneumonia" at [facility] /Yes to flu vax. Discuss Hep B or C vax)   Subjective: Victor Davies is a 47 y.o. male who presents for chronic ds management.  Wife is with him. His concerns today include:  Patient with history of HTN, HL,  tob dep, CVA., marijuana use, ETOH use disorder    Patient was seen at urgent care 03/24/2022 with reports of cough x 1 week that had not improved with over-the-counter medication.  Did not have any fever, nasal congestion or weakness.  Vital signs were stable.  Pulse ox was 95% on room air.  On exam, crackles were reportedly heard over the left side.  Chest x-ray was read as no infiltrate or other acute findings.  Differential diagnosis was viral bronchitis, CAP, aspiration pneumonia.  He was placed on Z-Pak. Completed abx yesterday.  Feeling better  HTN/CVA: compliant with meds:  ASA 325 OTC, amlodipine, carvedilol and lisinopril Checks BP about once a mth.  Gives range 120/90s Limits salt No CP/LE edema HL:  compliant with and tolerating Lipitor Still taking Zoloft for mood swings and doing okay on it. Starting smoking again 1-2 mths ago. Attributes to family stress.  Has patches at home; they were helpful.  Still taking [id number] and Folic Acid OTC.  Has B12 def  Patient has started drinking again.  He drinks 1 glass of red wine and 2 two 12 ounce beers daily.  Intermittently has a shot of liquor.  Again he attributes it to stress.  C/o pain over LT shoulder blade that started this a.m around 3 a.m.  Constant.  -"Like someone pulling my muscle."  No Numbness/tingling or SOB. Did not take anything for it.    HM:  wife ask about asking about hep B vaccine; not sure if he received in past.  Yes to flu vaccine.  Never got in with GI; wife said it was her fault because she was not able to take him.  Would like to have  referral resubmitted.   Patient Active Problem List   Diagnosis Date Noted   Prediabetes 11/17/2020   Tinea versicolor 11/17/2020   Vitamin B12 deficiency 11/17/2020   Mood disorder ([place]) 07/08/2020   Insomnia 07/08/2020   History of CVA (cerebrovascular accident)    Hypertension    Former tobacco use 08/06/2019   History of CVA with residual deficit 06/05/2019   Acute ischemic stroke (HCC)    Alcohol use disorder, severe, in early remission ([place])    Hypokalemia    Marijuana abuse    Acute CVA (cerebrovascular accident) ([place]) 05/17/2019   Essential hypertension 05/10/2019   CVA, old, alterations of sensations 05/10/2019   Opacity of lung on imaging study 05/10/2019     Current Outpatient Medications on File Prior to Visit  Medication Sig Dispense Refill   amLODipine (NORVASC) 10 MG tablet Take 1 tablet (10 mg total) by mouth daily. OFFICE VISIT NEEDED FOR ADDITIONAL REFILLS 30 tablet 0   aspirin EC 325 MG EC tablet Take 1 tablet (325 mg total) by mouth daily. 30 tablet 0   atorvastatin (LIPITOR) 80 MG tablet TAKE 1 TABLET (80 MG TOTAL) BY MOUTH DAILY. 30 tablet 0   carvedilol (COREG) 6.25 MG tablet Take 0.5 tablets (3.125 mg total) by mouth 2 (two) times daily with a meal. 60 tablet 0   diclofenac Sodium (  VOLTAREN) 1 % GEL Apply 2 g topically 4 (four) times daily. For knee stiffness 100 g 1   ketoconazole (NIZORAL) 2 % cream Apply 1 application topically daily. Apply to rash around neck 15 g 0   lisinopril (ZESTRIL) 20 MG tablet Take 1 tablet by mouth daily. 30 tablet 0   sertraline (ZOLOFT) 50 MG tablet Take 1 tablet (50 mg total) by mouth daily. 30 tablet 0   No current facility-administered medications on file prior to visit.    No Known Allergies  Social History   Socioeconomic History   Marital status: Married    Spouse name: Kathline Magic    Number of children: 2   Years of education: Not on file   Highest education level: 9th grade  Occupational History   Occupation:  unemployed  Tobacco Use   Smoking status: Some Days    Packs/day: 0.25    Years: 20.00    Total pack years: 5.00    Types: Cigarettes   Smokeless tobacco: Never  Vaping Use   Vaping Use: Never used  Substance and Sexual Activity   Alcohol use: Yes    Alcohol/week: 14.0 - 21.0 standard drinks of alcohol    Types: 14 - 21 Cans of beer per week   Drug use: Not on file   Sexual activity: Not on file  Other Topics Concern   Not on file  Social History Narrative   Not on file   Social Determinants of Health   Financial Resource Strain: Not on file  Food Insecurity: Not on file  Transportation Needs: No Transportation Needs (06/05/2019)   PRAPARE - Hydrologist (Medical): No    Lack of Transportation (Non-Medical): No  Physical Activity: Not on file  Stress: Not on file  Social Connections: Not on file  Intimate Partner Violence: Not on file    Family History  Problem Relation Age of Onset   Hypertension Father    Arthritis Mother     No past surgical history on file.  ROS: Review of Systems Negative except as stated above  PHYSICAL EXAM: BP 105/71 (BP Location: Left Arm, Patient Position: Sitting, Cuff Size: Normal)   Pulse 74   Temp 97.8 F (36.6 C) (Oral)   Ht '5\' 9"'$  (1.753 m)   Wt 183 lb (83 kg)   SpO2 97%   BMI 27.02 kg/m   Physical Exam   General appearance - alert, well appearing, middle-age male and in no distress Mental status -patient is soft-spoken.  Flat affect. Neck - supple, no significant adenopathy Chest - clear to auscultation anteriorly and posteriorly, no wheezes, rales or rhonchi, symmetric air entry Heart - normal rate, regular rhythm, normal S1, S2, no murmurs, rubs, clicks or gallops Musculoskeletal -no tenderness on palpation over the left scapula and surrounding muscles.  Good range of motion of the left shoulder. Extremities - peripheral pulses normal, no pedal edema, no clubbing or cyanosis    03/29/2022     8:52 AM 11/17/2020   10:02 AM 07/08/2020    3:25 PM  Depression screen PHQ 2/9  Decreased Interest 0 0 0  Down, Depressed, Hopeless 0 0 0  PHQ - 2 Score 0 0 0  Altered sleeping 1    Tired, decreased energy 1    Change in appetite 0    Feeling bad or failure about yourself  0    Trouble concentrating 0    Moving slowly or fidgety/restless 1    Suicidal  thoughts 0    PHQ-9 Score 3         Latest Ref Rng & Units 07/08/2020    4:21 PM 05/19/2019    4:35 AM 05/18/2019    5:25 AM  CMP  Glucose 65 - 99 mg/dL 146  97  97   BUN 6 - 24 mg/dL '16  11  8   '$ Creatinine 0.76 - 1.27 mg/dL 0.97  0.86  0.71   Sodium 134 - 144 mmol/L 138  140  139   Potassium 3.5 - 5.2 mmol/L 4.3  3.9  3.4   Chloride 96 - 106 mmol/L 100  106  103   CO2 20 - 29 mmol/L '22  26  25   '$ Calcium 8.7 - 10.2 mg/dL 9.1  9.3  9.2   Total Protein 6.0 - 8.5 g/dL 6.9     Total Bilirubin 0.0 - 1.2 mg/dL 0.7     Alkaline Phos 44 - 121 IU/L 72     AST 0 - 40 IU/L 15     ALT 0 - 44 IU/L 11      Lipid Panel     Component Value Date/Time   CHOL 143 07/08/2020 1621   TRIG 85 07/08/2020 1621   HDL 70 07/08/2020 1621   CHOLHDL 2.0 07/08/2020 1621   CHOLHDL 2.8 05/17/2019 0101   VLDL 5 05/17/2019 0101   LDLCALC 57 07/08/2020 1621    CBC    Component Value Date/Time   WBC 8.6 11/17/2020 1103   WBC 7.9 05/19/2019 0435   RBC 4.40 11/17/2020 1103   RBC 5.47 05/19/2019 0435   HGB 12.8 (L) 11/17/2020 1103   HCT 39.1 11/17/2020 1103   PLT 308 11/17/2020 1103   MCV 89 11/17/2020 1103   MCH 29.1 11/17/2020 1103   MCH 30.5 05/19/2019 0435   MCHC 32.7 11/17/2020 1103   MCHC 33.5 05/19/2019 0435   RDW 11.8 11/17/2020 1103   LYMPHSABS 2.5 05/17/2019 0617   MONOABS 0.6 05/17/2019 0617   EOSABS 0.2 05/17/2019 0617   BASOSABS 0.0 05/17/2019 0617    ASSESSMENT AND PLAN: 1. Essential hypertension At goal.  Continue amlodipine 10 mg daily, carvedilol 3.125 mg twice a day, lisinopril 20 mg daily. - CBC - Comprehensive  metabolic panel - Lipid panel - amLODipine (NORVASC) 10 MG tablet; Take 1 tablet (10 mg total) by mouth daily.  Dispense: 90 tablet; Refill: 0 - carvedilol (COREG) 6.25 MG tablet; Take 0.5 tablets (3.125 mg total) by mouth 2 (two) times daily with a meal.  Dispense: 180 tablet; Refill: 1 - lisinopril (ZESTRIL) 20 MG tablet; Take 1 tablet by mouth daily.  Dispense: 90 tablet; Refill: 1  2. Mood disorder [facility]) Patient reports he is doing okay on Zoloft.  However he reports increased life stresses and seems reluctant to expand on it in front of his wife.  I have given him a handout of behavioral health resources in the [place] area and encouraged him to reach out if he feels he needs some counseling. - sertraline (ZOLOFT) 50 MG tablet; Take 1 tablet (50 mg total) by mouth daily.  Dispense: 90 tablet; Refill: 1  3. Vitamin B12 deficiency Continue over-the-counter vitamin B12 supplement - Vitamin B12  4. Tobacco dependence Strongly advised to quit.  He has nicotine patches which he states he will use when he is ready to give a trial of quitting again.  5. Alcohol use disorder, moderate, dependence ([place]) Strongly encouraged him to quit.  He would like  to quit.  Discussed health risks associated with ongoing alcohol use.  Discussed the medication naltrexone and its use to help decrease cravings for alcohol.  He is wanting to give the medication a try.  Currently not on any narcotic medications.  Advised of significant withdrawal symptoms if he is on any type of narcotics - naltrexone (DEPADE) 50 MG tablet; Take 1 tablet (50 mg total) by mouth daily.  Dispense: 30 tablet; Refill: 0  6. Stressful life event affecting family See #2 above. - sertraline (ZOLOFT) 50 MG tablet; Take 1 tablet (50 mg total) by mouth daily.  Dispense: 90 tablet; Refill: 1  7. Myofascial pain on left side Recommend use of Voltaren gel - diclofenac Sodium (VOLTAREN) 1 % GEL; Apply 2 g topically 2 (two) times daily as  needed.  Dispense: 100 g; Refill: 1  8. Screening for colon cancer - Ambulatory referral to Gastroenterology  9. Need for hepatitis B screening test Patient reports having had quite a number of vaccines when he came to the [place] but is not sure whether he had the hepatitis B vaccine series.  We will check to see if he has been adequately immunized.  If he has not, we can have him come as a nurse only visit to get vaccine series for hepatitis a and B. - Hepatitis B surface antibody,quantitative - Hepatitis B surface antigen  10. Need for immunization against influenza - Flu Vaccine QUAD 48moIM (Fluarix, Fluzone & Alfiuria Quad PF)    Patient was given the opportunity to ask questions.  Patient verbalized understanding of the plan and was able to repeat key elements of the plan.   This documentation was completed using DRadio producer  Any transcriptional errors are unintentional.  No orders of the defined types were placed in this encounter.    Requested Prescriptions    No prescriptions requested or ordered in this encounter    No follow-ups on file.  DKarle Plumber MD, FACP

## 2022-03-30 LAB — LIPID PANEL
Chol/HDL Ratio: 2.5 ratio (ref 0.0–5.0)
Cholesterol, Total: 191 mg/dL (ref 100–199)
HDL: 76 mg/dL (ref >39)
LDL Chol Calc (NIH): 95 mg/dL (ref 0–99)
Triglycerides: 112 mg/dL (ref 0–149)
VLDL Cholesterol Cal: 20 mg/dL (ref 5–40)

## 2022-03-30 LAB — CBC
Hematocrit: 44.4 % (ref 37.5–51.0)
Hemoglobin: 14.8 g/dL (ref 13.0–17.7)
MCH: 29.8 pg (ref 26.6–33.0)
MCHC: 33.3 g/dL (ref 31.5–35.7)
MCV: 90 fL (ref 79–97)
Platelets: 399 10*3/uL (ref 150–450)
RBC: 4.96 x10E6/uL (ref 4.14–5.80)
RDW: 13 % (ref 11.6–15.4)
WBC: 7.1 10*3/uL (ref 3.4–10.8)

## 2022-03-30 LAB — COMPREHENSIVE METABOLIC PANEL
ALT: 11 IU/L (ref 0–44)
AST: 18 IU/L (ref 0–40)
Albumin/Globulin Ratio: 1.4 (ref 1.2–2.2)
Albumin: 4.5 g/dL (ref 4.1–5.1)
Alkaline Phosphatase: 89 IU/L (ref 44–121)
BUN/Creatinine Ratio: 9 (ref 9–20)
BUN: 9 mg/dL (ref 6–24)
Bilirubin Total: 0.4 mg/dL (ref 0.0–1.2)
CO2: 22 mmol/L (ref 20–29)
Calcium: 9.6 mg/dL (ref 8.7–10.2)
Chloride: 100 mmol/L (ref 96–106)
Creatinine, Ser: 1.03 mg/dL (ref 0.76–1.27)
Globulin, Total: 3.3 g/dL (ref 1.5–4.5)
Glucose: 91 mg/dL (ref 70–99)
Potassium: 5 mmol/L (ref 3.5–5.2)
Sodium: 139 mmol/L (ref 134–144)
Total Protein: 7.8 g/dL (ref 6.0–8.5)
eGFR: 90 mL/min/{1.73_m2} (ref >59)

## 2022-03-30 LAB — VITAMIN B12: Vitamin B-12: 1530 pg/mL — ABNORMAL HIGH (ref 232–1245)

## 2022-03-30 LAB — HEPATITIS B SURFACE ANTIBODY, QUANTITATIVE: Hepatitis B Surf Ab Quant: <3.1 m[IU]/mL — ABNORMAL LOW (ref >9.9)

## 2022-03-30 LAB — HEPATITIS B SURFACE ANTIGEN: Hepatitis B Surface Ag: NEGATIVE

## 2022-04-07 ENCOUNTER — Encounter: Payer: Self-pay | Admitting: Internal Medicine

## 2022-04-19 ENCOUNTER — Encounter: Payer: Self-pay | Admitting: Internal Medicine

## 2022-04-20 ENCOUNTER — Ambulatory Visit (AMBULATORY_SURGERY_CENTER): Payer: Medicaid Other

## 2022-04-20 VITALS — Ht 69.0 in | Wt 181.0 lb

## 2022-04-20 DIAGNOSIS — Z1211 Encounter for screening for malignant neoplasm of colon: Secondary | ICD-10-CM

## 2022-04-20 MED ORDER — PEG 3350-KCL-NA BICARB-NACL 420 G PO SOLR: 4000.0000 mL | Freq: Once | ORAL | 0 refills | Status: AC

## 2022-04-20 NOTE — Progress Notes (Signed)
No egg or soy allergy known to patient  No issues known to pt with past sedation with any surgeries or procedures Patient denies ever being told they had issues or difficulty with intubation  No FH of Malignant Hyperthermia Pt is not on diet pills Pt is not on  home 02  Pt is not on blood thinners  Pt with c/o constipation sometimes  No A fib or A flutter Have any cardiac testing pending--no  Pt instructed to use Singlecare.com or GoodRx for a price reduction on prep   Patient's chart reviewed by Osvaldo Angst CNRA prior to previsit and patient appropriate for the [place].  Previsit completed and red dot placed by patient's name on their procedure day (on provider's schedule).

## 2022-05-21 ENCOUNTER — Encounter: Payer: Self-pay | Admitting: Gastroenterology

## 2022-05-21 ENCOUNTER — Ambulatory Visit (AMBULATORY_SURGERY_CENTER): Payer: Medicaid Other | Admitting: Gastroenterology

## 2022-05-21 VITALS — BP 109/77 | HR 73 | Temp 97.3°F | Resp 21 | Ht 69.0 in | Wt 181.0 lb

## 2022-05-21 DIAGNOSIS — Z1211 Encounter for screening for malignant neoplasm of colon: Secondary | ICD-10-CM | POA: Diagnosis not present

## 2022-05-21 DIAGNOSIS — D122 Benign neoplasm of ascending colon: Secondary | ICD-10-CM

## 2022-05-21 DIAGNOSIS — D124 Benign neoplasm of descending colon: Secondary | ICD-10-CM

## 2022-05-21 DIAGNOSIS — D123 Benign neoplasm of transverse colon: Secondary | ICD-10-CM

## 2022-05-21 MED ORDER — SODIUM CHLORIDE 0.9 % IV SOLN
500.0000 mL | Freq: Once | INTRAVENOUS | Status: DC
Start: 2022-05-21 — End: 2022-05-21

## 2022-05-21 NOTE — Op Note (Addendum)
[facility] Patient Name: Victor Davies Procedure Date: 05/21/2022 8:18 AM MRN: [id number] Endoscopist: Lorin Picket E. Tomasa Rand , MD, [id number] Age: 47 Referring MD:  Date of Birth: [birth year] Gender: Male Account #: [id number] Procedure:                Colonoscopy Indications:              Screening for colorectal malignant neoplasm, This                            is the patient's first colonoscopy Medicines:                Monitored Anesthesia Care Procedure:                Pre-Anesthesia Assessment:                           - Prior to the procedure, a History and Physical                            was performed, and patient medications and                            allergies were reviewed. The patient's tolerance of                            previous anesthesia was also reviewed. The risks                            and benefits of the procedure and the sedation                            options and risks were discussed with the patient.                            All questions were answered, and informed consent                            was obtained. Prior Anticoagulants: The patient has                            taken no anticoagulant or antiplatelet agents                            except for aspirin. ASA Grade Assessment: II - A                            patient with mild systemic disease. After reviewing                            the risks and benefits, the patient was deemed in                            satisfactory condition to undergo the procedure.  After obtaining informed consent, the colonoscope                            was passed under direct vision. Throughout the                            procedure, the patient's blood pressure, pulse, and                            oxygen saturations were monitored continuously. The                            Olympus SN [id number] was introduced through the anus                             and advanced to the the terminal ileum, with                            identification of the appendiceal orifice and IC                            valve. The colonoscopy was performed without                            difficulty. The patient tolerated the procedure                            well. The quality of the bowel preparation was                            excellent. The terminal ileum, ileocecal valve,                            appendiceal orifice, and rectum were photographed.                            The bowel preparation used was GoLYTELY via split                            dose instruction. Scope In: 8:35:08 AM Scope Out: 8:51:46 AM Scope Withdrawal Time: 0 hours 13 minutes 40 seconds  Total Procedure Duration: 0 hours 16 minutes 38 seconds  Findings:                 The perianal and digital rectal examinations were                            normal. Pertinent negatives include normal                            sphincter tone and no palpable rectal lesions.                           A 4 mm polyp was found in the ascending colon. The  polyp was sessile. The polyp was removed with a                            cold snare. Resection and retrieval were complete.                            Estimated blood loss was minimal.                           Two sessile polyps were found in the transverse                            colon. The polyps were 6 to 10 mm in size. These                            polyps were removed with a cold snare. Resection                            and retrieval were complete. Estimated blood loss                            was minimal.                           Two sessile polyps were found in the splenic                            flexure. The polyps were 2 to 7 mm in size. These                            polyps were removed with a cold snare. Resection                            and retrieval were complete. Estimated  blood loss                            was minimal.                           A 10 mm polyp was found in the descending colon.                            The polyp was semi-pedunculated. The polyp was                            removed with a cold snare. Resection and retrieval                            were complete. Estimated blood loss was minimal.                           The exam was otherwise normal throughout the  examined colon.                           The terminal ileum appeared normal.                           The retroflexed view of the distal rectum and anal                            verge was normal and showed no anal or rectal                            abnormalities. Complications:            No immediate complications. Estimated Blood Loss:     Estimated blood loss was minimal. Impression:               - One 4 mm polyp in the ascending colon, removed                            with a cold snare. Resected and retrieved.                           - Two 6 to 10 mm polyps in the transverse colon,                            removed with a cold snare. Resected and retrieved.                           - Two 2 to 7 mm polyps at the splenic flexure,                            removed with a cold snare. Resected and retrieved.                           - One 10 mm polyp in the descending colon, removed                            with a cold snare. Resected and retrieved.                           - The examined portion of the ileum was normal.                           - The distal rectum and anal verge are normal on                            retroflexion view.                           - The GI Genius (intelligent endoscopy module),                            computer-aided polyp detection system powered by AI  was utilized to detect colorectal polyps through                            enhanced visualization during  colonoscopy. Recommendation:           - Patient has a contact number available for                            emergencies. The signs and symptoms of potential                            delayed complications were discussed with the                            patient. Return to normal activities tomorrow.                            Written discharge instructions were provided to the                            patient.                           - Resume previous diet.                           - Continue present medications.                           - Await pathology results.                           - Repeat colonoscopy (date not yet determined) for                            surveillance based on pathology results. Elantra Caprara E. Tomasa Rand, MD 05/21/2022 8:57:22 AM This report has been signed electronically.
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
## 2022-05-21 NOTE — Progress Notes (Signed)
VS completed by DT.  Pt's states no medical or surgical changes since previsit or office visit.  

## 2022-05-21 NOTE — Patient Instructions (Signed)
Resume previous diet and medications. Awaiting pathology results. Repeat Colonoscopy date to be determined based on pathology results.  YOU HAD AN ENDOSCOPIC PROCEDURE TODAY AT THE [facility]:   Refer to the procedure report that was given to you for any specific questions about what was found during the examination.  If the procedure report does not answer your questions, please call your gastroenterologist to clarify.  If you requested that your care partner not be given the details of your procedure findings, then the procedure report has been included in a sealed envelope for you to review at your convenience later.  YOU SHOULD EXPECT: Some feelings of bloating in the abdomen. Passage of more gas than usual.  Walking can help get rid of the air that was put into your GI tract during the procedure and reduce the bloating. If you had a lower endoscopy (such as a colonoscopy or flexible sigmoidoscopy) you may notice spotting of blood in your stool or on the toilet paper. If you underwent a bowel prep for your procedure, you may not have a normal bowel movement for a few days.  Please Note:  You might notice some irritation and congestion in your nose or some drainage.  This is from the oxygen used during your procedure.  There is no need for concern and it should clear up in a day or so.  SYMPTOMS TO REPORT IMMEDIATELY:  Following lower endoscopy (colonoscopy or flexible sigmoidoscopy):  Excessive amounts of blood in the stool  Significant tenderness or worsening of abdominal pains  Swelling of the abdomen that is new, acute  Fever of 100F or higher  For urgent or emergent issues, a gastroenterologist can be reached at any hour by calling [phone]. Do not use MyChart messaging for urgent concerns.    DIET:  We do recommend a small meal at first, but then you may proceed to your regular diet.  Drink plenty of fluids but you should avoid alcoholic beverages for 24  hours.  ACTIVITY:  You should plan to take it easy for the rest of today and you should NOT DRIVE or use heavy machinery until tomorrow (because of the sedation medicines used during the test).    FOLLOW UP: Our staff will call the number listed on your records the next business day following your procedure.  We will call around 7:15- 8:00 am to check on you and address any questions or concerns that you may have regarding the information given to you following your procedure. If we do not reach you, we will leave a message.     If any biopsies were taken you will be contacted by phone or by letter within the next 1-3 weeks.  Please call us at [phone] if you have not heard about the biopsies in 3 weeks.    SIGNATURES/CONFIDENTIALITY: You and/or your care partner have signed paperwork which will be entered into your electronic medical record.  These signatures attest to the fact that that the information above on your After Visit Summary has been reviewed and is understood.  Full responsibility of the confidentiality of this discharge information lies with you and/or your care-partner.  

## 2022-05-21 NOTE — Progress Notes (Signed)
Called to room to assist during endoscopic procedure.  Patient ID and intended procedure confirmed with present staff. Received instructions for my participation in the procedure from the performing physician.  

## 2022-05-21 NOTE — Progress Notes (Signed)
Sedate, gd SR, tolerated procedure well, VSS, report to RN 

## 2022-05-24 ENCOUNTER — Telehealth: Payer: Self-pay | Admitting: *Deleted

## 2022-05-24 NOTE — Telephone Encounter (Signed)
  Follow up Call-     05/21/2022    7:52 AM  Call back number  Post procedure Call Back phone  # [phone]  Permission to leave phone message Yes     Patient questions:   Message left to call us if necessary.

## 2022-05-27 NOTE — Progress Notes (Signed)
Mr. Mackowski,   All six polyps that I removed during your recent procedure were completely benign but were proven to be "pre-cancerous" polyps that MAY have grown into cancers if they had not been removed.  Studies shows that at least 20% of women over age 48 and 30% of men over age 77 have pre-cancerous polyps.  Based on current nationally recognized surveillance guidelines, I recommend that you have a repeat colonoscopy in 3 years.   If you develop any new rectal bleeding, abdominal pain or significant bowel habit changes, please contact me before then.

## 2022-06-28 ENCOUNTER — Other Ambulatory Visit: Payer: Self-pay | Admitting: Internal Medicine

## 2022-06-28 ENCOUNTER — Encounter: Payer: Self-pay | Admitting: Internal Medicine

## 2022-06-28 DIAGNOSIS — I10 Essential (primary) hypertension: Secondary | ICD-10-CM

## 2022-07-01 ENCOUNTER — Other Ambulatory Visit: Payer: Self-pay

## 2022-07-04 ENCOUNTER — Encounter: Payer: Self-pay | Admitting: Internal Medicine

## 2022-07-08 ENCOUNTER — Telehealth: Payer: Self-pay | Admitting: Internal Medicine

## 2022-07-08 DIAGNOSIS — I10 Essential (primary) hypertension: Secondary | ICD-10-CM

## 2022-07-08 NOTE — Telephone Encounter (Signed)
Prescription placed on desk. Awaiting signature.

## 2022-07-14 ENCOUNTER — Other Ambulatory Visit: Payer: Self-pay

## 2022-07-14 ENCOUNTER — Telehealth: Payer: Self-pay

## 2022-07-14 NOTE — Telephone Encounter (Signed)
Prescription successfully faxed to the [facility] at [address] Fax #: [phone].

## 2022-07-14 NOTE — Telephone Encounter (Signed)
Copied from CRM [id number]. Topic: General - Other >> Jul 14, 2022  1:00 PM Santiya F wrote: Reason for CRM: [facility] pharmacy is calling in because they were sent a fax for a bp cuff and per pharmacy, they only sell cuffs over the counter. Pharmacy says the order should be faxed to a medical supply company.

## 2022-07-14 NOTE — Telephone Encounter (Signed)
Please send it to [facility] or which ever medical equipment company will dispense blood pressure monitors.  Thank you.

## 2022-07-15 ENCOUNTER — Other Ambulatory Visit: Payer: Self-pay | Admitting: Internal Medicine

## 2022-07-15 DIAGNOSIS — I10 Essential (primary) hypertension: Secondary | ICD-10-CM

## 2022-07-15 NOTE — Telephone Encounter (Signed)
Requested Prescriptions  Pending Prescriptions Disp Refills   amLODipine (NORVASC) 10 MG tablet [Pharmacy Med Name: amLODIPine Besylate 10 MG Oral Tablet] 90 tablet 0    Sig: TAKE 1 TABLET BY MOUTH ONCE DAILY . APPOINTMENT REQUIRED FOR FUTURE REFILLS     Cardiovascular: Calcium Channel Blockers 2 Passed - 07/15/2022 12:56 PM      Passed - Last BP in normal range    BP Readings from Last 1 Encounters:  05/21/22 109/77         Passed - Last Heart Rate in normal range    Pulse Readings from Last 1 Encounters:  05/21/22 73         Passed - Valid encounter within last 6 months    Recent Outpatient Visits           3 months ago Essential hypertension   [place] [facility] Marcine Matar, MD   1 year ago Essential hypertension   [place] [facility] Marcine Matar, MD   1 year ago Essential hypertension   [facility] Marcine Matar, MD   2 years ago Essential hypertension    [place] [facility] Marcine Matar, MD   2 years ago Essential hypertension    [facility] Marcine Matar, MD       Future Appointments             In 2 weeks Marcine Matar, MD [facility]

## 2022-07-16 NOTE — Telephone Encounter (Signed)
Bp prescription successfully sent to [facility] on 07/16/2022.

## 2022-08-02 ENCOUNTER — Encounter: Payer: Self-pay | Admitting: Internal Medicine

## 2022-08-02 ENCOUNTER — Ambulatory Visit: Payer: Medicaid Other | Attending: Internal Medicine | Admitting: Internal Medicine

## 2022-08-02 VITALS — BP 112/73 | HR 68 | Temp 98.1°F | Ht 69.0 in | Wt 169.0 lb

## 2022-08-02 DIAGNOSIS — I10 Essential (primary) hypertension: Secondary | ICD-10-CM | POA: Diagnosis present

## 2022-08-02 DIAGNOSIS — F17200 Nicotine dependence, unspecified, uncomplicated: Secondary | ICD-10-CM | POA: Diagnosis not present

## 2022-08-02 DIAGNOSIS — F1020 Alcohol dependence, uncomplicated: Secondary | ICD-10-CM | POA: Diagnosis not present

## 2022-08-02 DIAGNOSIS — F39 Unspecified mood [affective] disorder: Secondary | ICD-10-CM | POA: Diagnosis not present

## 2022-08-02 DIAGNOSIS — Z23 Encounter for immunization: Secondary | ICD-10-CM | POA: Diagnosis not present

## 2022-08-02 DIAGNOSIS — Z86010 Personal history of colonic polyps: Secondary | ICD-10-CM | POA: Insufficient documentation

## 2022-08-02 DIAGNOSIS — Z7982 Long term (current) use of aspirin: Secondary | ICD-10-CM | POA: Diagnosis not present

## 2022-08-02 DIAGNOSIS — Z79899 Other long term (current) drug therapy: Secondary | ICD-10-CM | POA: Diagnosis not present

## 2022-08-02 DIAGNOSIS — I6930 Unspecified sequelae of cerebral infarction: Secondary | ICD-10-CM | POA: Insufficient documentation

## 2022-08-02 DIAGNOSIS — R7303 Prediabetes: Secondary | ICD-10-CM

## 2022-08-02 LAB — POCT GLYCOSYLATED HEMOGLOBIN (HGB A1C): HbA1c, POC (prediabetic range): 5.3 % — AB (ref 5.7–6.4)

## 2022-08-02 LAB — GLUCOSE, POCT (MANUAL RESULT ENTRY): POC Glucose: 97 mg/dl (ref 70–99)

## 2022-08-02 MED ORDER — SERTRALINE HCL 50 MG PO TABS: 50.0000 mg | ORAL_TABLET | Freq: Every day | ORAL | 1 refills | Status: DC

## 2022-08-02 MED ORDER — LISINOPRIL 20 MG PO TABS: ORAL_TABLET | Freq: Every day | ORAL | 1 refills | Status: DC

## 2022-08-02 MED ORDER — NALTREXONE HCL 50 MG PO TABS: 50.0000 mg | ORAL_TABLET | Freq: Every day | ORAL | 1 refills | Status: DC

## 2022-08-02 MED ORDER — ATORVASTATIN CALCIUM 80 MG PO TABS: ORAL_TABLET | Freq: Every day | ORAL | 1 refills | Status: DC

## 2022-08-02 MED ORDER — AMLODIPINE BESYLATE 10 MG PO TABS
ORAL_TABLET | ORAL | 1 refills | Status: DC
Start: 2022-08-02 — End: 2023-02-16

## 2022-08-02 NOTE — Progress Notes (Signed)
Patient ID: Victor Davies, male    DOB: [birth year]  MRN: [id number]  CC: Hypertension (HTN f/u. Med refills. /)   Subjective: Victor Davies is a 47 y.o. male who presents for chronic ds management.  His 37 yr old son is with him. His concerns today include:  Patient with history of HTN, HL,  tob dep, CVA., marijuana use, ETOH use disorder    HTN/history of CVA: Reports compliance with aspirin, Norvasc 10 mg daily, carvedilol 3.125 mg twice a day, lisinopril 20, and Lipitor 80 mg daily. -checks BP occasionally.  Reports levels have been good. -No CP/SOB/LE edema  Tob dep: still smoking and not ready to quit.   Still doing well on Zoloft for mood disorder.  Hx of PreDM Results for orders placed or performed in visit on 08/02/22  POCT glucose (manual entry)  Result Value Ref Range   POC Glucose 97 70 - 99 mg/dl  POCT glycosylated hemoglobin (Hb A1C)  Result Value Ref Range   Hemoglobin A1C     HbA1c POC (<> result, manual entry)     HbA1c, POC (prediabetic range) 5.3 (A) 5.7 - 6.4 %   HbA1c, POC (controlled diabetic range)    B12 deficiency: On last visit B12 level was greater than 1000.  Advised that he decrease the B12 supplement to 500 mcg daily. Reports compliance.    Alcohol use disorder: On last visit, he admitted that he had started drinking again.  He was drinking 1 glass of red wine and two 12 ounce beers daily.  He was also intermittently drinking a shot of liquor. Today he reports he has cut back to 1-2 12 oz beers a day; stopped the wine and shots and liquor.  Would like to quit.  Has some Naltrexone left at home.   Since last visit, he has had colonoscopy.  6 adenomatous polyps removed.  Due for repeat again in 3 years.  HM:  he was not adequately immunized against hep B on blood test done 03/2022.  Agreeable to starting the vaccine series.  Patient Active Problem List   Diagnosis Date Noted   Alcohol use disorder, moderate, dependence (HCC) 03/29/2022    History of hypertension 03/24/2022   Prediabetes 11/17/2020   Tinea versicolor 11/17/2020   Vitamin B12 deficiency 11/17/2020   Mood disorder (HCC) 07/08/2020   Insomnia 07/08/2020   History of CVA (cerebrovascular accident)    History of CVA with residual deficit 06/05/2019   Hypokalemia    Marijuana abuse    Essential hypertension 05/10/2019   Tobacco dependence 05/10/2019   CVA, old, alterations of sensations 05/10/2019   Opacity of lung on imaging study 05/10/2019     Current Outpatient Medications on File Prior to Visit  Medication Sig Dispense Refill   aspirin EC 325 MG EC tablet Take 1 tablet (325 mg total) by mouth daily. 30 tablet 0   carvedilol (COREG) 6.25 MG tablet Take 0.5 tablets (3.125 mg total) by mouth 2 (two) times daily with a meal. 180 tablet 1   Cyanocobalamin (VITAMIN B 12) 500 MCG TABS Take 500 mcg by mouth daily.     diclofenac Sodium (VOLTAREN) 1 % GEL Apply 2 g topically 2 (two) times daily as needed. 100 g 1   ketoconazole (NIZORAL) 2 % cream Apply 1 application topically daily. Apply to rash around neck 15 g 0   No current facility-administered medications on file prior to visit.    No Known Allergies  Social History  Socioeconomic History   Marital status: Married    Spouse name: [name]    Number of children: 2   Years of education: Not on file   Highest education level: 10th grade  Occupational History   Occupation: unemployed  Tobacco Use   Smoking status: Every Day    Packs/day: 0.25    Years: 20.00    Additional pack years: 0.00    Total pack years: 5.00    Types: Cigarettes   Smokeless tobacco: Never  Vaping Use   Vaping Use: Never used  Substance and Sexual Activity   Alcohol use: Yes    Alcohol/week: 14.0 - 21.0 standard drinks of alcohol    Types: 14 - 21 Cans of beer per week   Drug use: Not Currently   Sexual activity: Not on file  Other Topics Concern   Not on file  Social History Narrative   Not on file   Social  Determinants of Health   Financial Resource Strain: Low Risk  (08/01/2022)   Overall Financial Resource Strain (CARDIA)    Difficulty of Paying Living Expenses: Not hard at all  Food Insecurity: No Food Insecurity (08/01/2022)   Hunger Vital Sign    Worried About Running Out of Food in the Last Year: Never true    Ran Out of Food in the Last Year: Never true  Transportation Needs: No Transportation Needs (08/01/2022)   PRAPARE - Administrator, Civil Service (Medical): No    Lack of Transportation (Non-Medical): No  Physical Activity: Unknown (08/01/2022)   Exercise Vital Sign    Days of Exercise per Week: 0 days    Minutes of Exercise per Session: Not on file  Stress: No Stress Concern Present (08/01/2022)   [employer] of Occupational Health - Occupational Stress Questionnaire    Feeling of Stress : Only a little  Social Connections: Socially Isolated (08/01/2022)   Social Connection and Isolation Panel [NHANES]    Frequency of Communication with Friends and Family: Once a week    Frequency of Social Gatherings with Friends and Family: Once a week    Attends Religious Services: Never    Database administrator or Organizations: No    Attends Engineer, structural: Not on file    Marital Status: Married  Catering manager Violence: Not on file    Family History  Problem Relation Age of Onset   Arthritis Mother    Hypertension Father    Colon cancer Neg Hx    Colon polyps Neg Hx    Esophageal cancer Neg Hx    Rectal cancer Neg Hx    Stomach cancer Neg Hx     History reviewed. No pertinent surgical history.  ROS: Review of Systems Negative except as stated above  PHYSICAL EXAM: BP 112/73 (BP Location: Left Arm, Patient Position: Sitting, Cuff Size: Normal)   Pulse 68   Temp 98.1 F (36.7 C) (Oral)   Ht 5\' 9"  (1.753 m)   Wt 169 lb (76.7 kg)   SpO2 99%   BMI 24.96 kg/m   Physical Exam   General appearance - alert, well appearing, middle-age male  and in no distress Mental status - normal mood, behavior, speech, dress, motor activity, and thought processes Mouth - mucous membranes moist, pharynx normal without lesions Neck - supple, no significant adenopathy Chest - clear to auscultation, no wheezes, rales or rhonchi, symmetric air entry Heart - normal rate, regular rhythm, normal S1, S2, no murmurs, rubs, clicks  or gallops Extremities - peripheral pulses normal, no pedal edema, no clubbing or cyanosis     08/02/2022    9:45 AM 03/29/2022    8:52 AM 11/17/2020   10:02 AM  Depression screen PHQ 2/9  Decreased Interest 1 0 0  Down, Depressed, Hopeless 2 0 0  PHQ - 2 Score 3 0 0  Altered sleeping 0 1   Tired, decreased energy 2 1   Change in appetite 0 0   Feeling bad or failure about yourself  2 0   Trouble concentrating 0 0   Moving slowly or fidgety/restless 0 1   Suicidal thoughts 0 0   PHQ-9 Score 7 3        Latest Ref Rng & Units 03/29/2022    9:38 AM 07/08/2020    4:21 PM 05/19/2019    4:35 AM  CMP  Glucose 70 - 99 mg/dL 91  161  97   BUN 6 - 24 mg/dL 9  16  11    Creatinine 0.76 - 1.27 mg/dL 0.96  0.45  4.09   Sodium 134 - 144 mmol/L 139  138  140   Potassium 3.5 - 5.2 mmol/L 5.0  4.3  3.9   Chloride 96 - 106 mmol/L 100  100  106   CO2 20 - 29 mmol/L 22  22  26    Calcium 8.7 - 10.2 mg/dL 9.6  9.1  9.3   Total Protein 6.0 - 8.5 g/dL 7.8  6.9    Total Bilirubin 0.0 - 1.2 mg/dL 0.4  0.7    Alkaline Phos 44 - 121 IU/L 89  72    AST 0 - 40 IU/L 18  15    ALT 0 - 44 IU/L 11  11     Lipid Panel     Component Value Date/Time   CHOL 191 03/29/2022 0938   TRIG 112 03/29/2022 0938   HDL 76 03/29/2022 0938   CHOLHDL 2.5 03/29/2022 0938   CHOLHDL 2.8 05/17/2019 0101   VLDL 5 05/17/2019 0101   LDLCALC 95 03/29/2022 0938    CBC    Component Value Date/Time   WBC 7.1 03/29/2022 0938   WBC 7.9 05/19/2019 0435   RBC 4.96 03/29/2022 0938   RBC 5.47 05/19/2019 0435   HGB 14.8 03/29/2022 0938   HCT 44.4 03/29/2022  0938   PLT 399 03/29/2022 0938   MCV 90 03/29/2022 0938   MCH 29.8 03/29/2022 0938   MCH 30.5 05/19/2019 0435   MCHC 33.3 03/29/2022 0938   MCHC 33.5 05/19/2019 0435   RDW 13.0 03/29/2022 0938   LYMPHSABS 2.5 05/17/2019 0617   MONOABS 0.6 05/17/2019 0617   EOSABS 0.2 05/17/2019 0617   BASOSABS 0.0 05/17/2019 0617    ASSESSMENT AND PLAN: 1. Essential hypertension At goal.  Continue amlodipine 10 mg daily, carvedilol 3.125 mg twice a day and lisinopril 20 mg daily. - amLODipine (NORVASC) 10 MG tablet; TAKE 1 TABLET BY MOUTH ONCE DAILY . APPOINTMENT REQUIRED FOR FUTURE REFILLS  Dispense: 90 tablet; Refill: 1 - lisinopril (ZESTRIL) 20 MG tablet; Take 1 tablet by mouth daily.  Dispense: 90 tablet; Refill: 1  2. Alcohol use disorder, moderate, dependence (HCC) Commended him on cutting back.  Challenged him to decrease further to just one 12 oz beer a day.  He requests refill on naltrexone.  He reports he is not on any narcotic medications and no street drugs. - naltrexone (DEPADE) 50 MG tablet; Take 1 tablet (50 mg total) by  mouth daily.  Dispense: 30 tablet; Refill: 1 - Hepatic Function Panel  3. Mood disorder (HCC) Doing well on Zoloft per his report.  Refill given. - sertraline (ZOLOFT) 50 MG tablet; Take 1 tablet (50 mg total) by mouth daily.  Dispense: 90 tablet; Refill: 1  4. Tobacco dependence Continue to encourage him to quit smoking given history of CVA in the past.  5. Prediabetes A1c and blood sugar today normal. - POCT glucose (manual entry) - POCT glycosylated hemoglobin (Hb A1C)  6. Hx of adenomatous colonic polyps Will be due for repeat colonoscopy in 3 years.  7. Need for vaccination against hepatitis B virus Will start hepatitis B vaccine series with first shot today.  Nurse only visit in 1 month for second shot.      Patient was given the opportunity to ask questions.  Patient verbalized understanding of the plan and was able to repeat key elements of the  plan.   This documentation was completed using Paediatric nurse.  Any transcriptional errors are unintentional.  Orders Placed This Encounter  Procedures   Hepatitis B vaccine adult IM   Hepatic Function Panel   POCT glucose (manual entry)   POCT glycosylated hemoglobin (Hb A1C)     Requested Prescriptions   Signed Prescriptions Disp Refills   amLODipine (NORVASC) 10 MG tablet 90 tablet 1    Sig: TAKE 1 TABLET BY MOUTH ONCE DAILY . APPOINTMENT REQUIRED FOR FUTURE REFILLS   atorvastatin (LIPITOR) 80 MG tablet 90 tablet 1    Sig: TAKE 1 TABLET (80 MG TOTAL) BY MOUTH DAILY.   lisinopril (ZESTRIL) 20 MG tablet 90 tablet 1    Sig: Take 1 tablet by mouth daily.   sertraline (ZOLOFT) 50 MG tablet 90 tablet 1    Sig: Take 1 tablet (50 mg total) by mouth daily.   naltrexone (DEPADE) 50 MG tablet 30 tablet 1    Sig: Take 1 tablet (50 mg total) by mouth daily.    Return in about 4 months (around 12/03/2022) for nurse visit in 1 mth for 2nd hep B vaccine.  Jonah Blue, MD, FACP

## 2022-08-03 LAB — HEPATIC FUNCTION PANEL
ALT: 13 IU/L (ref 0–44)
AST: 21 IU/L (ref 0–40)
Albumin: 4.3 g/dL (ref 4.1–5.1)
Alkaline Phosphatase: 79 IU/L (ref 44–121)
Bilirubin Total: 0.5 mg/dL (ref 0.0–1.2)
Bilirubin, Direct: 0.14 mg/dL (ref 0.00–0.40)
Total Protein: 7.2 g/dL (ref 6.0–8.5)

## 2022-09-03 ENCOUNTER — Encounter: Payer: Self-pay | Admitting: Internal Medicine

## 2022-09-03 ENCOUNTER — Other Ambulatory Visit: Payer: Self-pay | Admitting: Internal Medicine

## 2022-09-03 DIAGNOSIS — I10 Essential (primary) hypertension: Secondary | ICD-10-CM

## 2022-09-03 MED ORDER — CARVEDILOL 6.25 MG PO TABS: 3.1250 mg | ORAL_TABLET | Freq: Two times a day (BID) | ORAL | 1 refills | Status: DC

## 2022-10-20 ENCOUNTER — Other Ambulatory Visit: Payer: Self-pay | Admitting: Internal Medicine

## 2022-10-20 DIAGNOSIS — I10 Essential (primary) hypertension: Secondary | ICD-10-CM

## 2022-11-12 ENCOUNTER — Other Ambulatory Visit: Payer: Self-pay | Admitting: Internal Medicine

## 2022-11-12 DIAGNOSIS — F1020 Alcohol dependence, uncomplicated: Secondary | ICD-10-CM

## 2023-01-23 ENCOUNTER — Encounter: Payer: Self-pay | Admitting: Internal Medicine

## 2023-01-30 ENCOUNTER — Other Ambulatory Visit: Payer: Self-pay | Admitting: Internal Medicine

## 2023-01-30 DIAGNOSIS — F39 Unspecified mood [affective] disorder: Secondary | ICD-10-CM

## 2023-02-06 ENCOUNTER — Other Ambulatory Visit: Payer: Self-pay | Admitting: Internal Medicine

## 2023-02-06 DIAGNOSIS — F1020 Alcohol dependence, uncomplicated: Secondary | ICD-10-CM

## 2023-02-16 ENCOUNTER — Ambulatory Visit: Payer: Medicaid Other | Attending: Physician Assistant | Admitting: Physician Assistant

## 2023-02-16 VITALS — BP 108/72 | HR 72 | Wt 165.4 lb

## 2023-02-16 DIAGNOSIS — F39 Unspecified mood [affective] disorder: Secondary | ICD-10-CM | POA: Diagnosis not present

## 2023-02-16 DIAGNOSIS — F32A Depression, unspecified: Secondary | ICD-10-CM | POA: Diagnosis not present

## 2023-02-16 DIAGNOSIS — E785 Hyperlipidemia, unspecified: Secondary | ICD-10-CM | POA: Diagnosis not present

## 2023-02-16 DIAGNOSIS — F1020 Alcohol dependence, uncomplicated: Secondary | ICD-10-CM | POA: Diagnosis not present

## 2023-02-16 DIAGNOSIS — Z79899 Other long term (current) drug therapy: Secondary | ICD-10-CM | POA: Diagnosis not present

## 2023-02-16 DIAGNOSIS — I10 Essential (primary) hypertension: Secondary | ICD-10-CM | POA: Diagnosis not present

## 2023-02-16 MED ORDER — CARVEDILOL 6.25 MG PO TABS
3.1250 mg | ORAL_TABLET | Freq: Two times a day (BID) | ORAL | 1 refills | Status: DC
Start: 2023-02-16 — End: 2023-06-16

## 2023-02-16 MED ORDER — NALTREXONE HCL 50 MG PO TABS: 50.0000 mg | ORAL_TABLET | Freq: Every day | ORAL | 3 refills | Status: DC

## 2023-02-16 MED ORDER — AMLODIPINE BESYLATE 10 MG PO TABS: ORAL_TABLET | ORAL | 1 refills | Status: DC

## 2023-02-16 MED ORDER — ATORVASTATIN CALCIUM 80 MG PO TABS: ORAL_TABLET | Freq: Every day | ORAL | 1 refills | Status: DC

## 2023-02-16 MED ORDER — LISINOPRIL 20 MG PO TABS: ORAL_TABLET | Freq: Every day | ORAL | 1 refills | Status: DC

## 2023-02-16 MED ORDER — SERTRALINE HCL 50 MG PO TABS
50.0000 mg | ORAL_TABLET | Freq: Every day | ORAL | 1 refills | Status: DC
Start: 2023-02-16 — End: 2023-06-16
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
## 2023-02-17 ENCOUNTER — Telehealth: Payer: Self-pay

## 2023-02-17 ENCOUNTER — Encounter: Payer: Self-pay | Admitting: Physician Assistant

## 2023-02-17 LAB — COMPREHENSIVE METABOLIC PANEL
ALT: 13 [IU]/L (ref 0–44)
AST: 16 [IU]/L (ref 0–40)
Albumin: 4.3 g/dL (ref 4.1–5.1)
Alkaline Phosphatase: 61 [IU]/L (ref 44–121)
BUN/Creatinine Ratio: 11 (ref 9–20)
BUN: 12 mg/dL (ref 6–24)
Bilirubin Total: 0.4 mg/dL (ref 0.0–1.2)
CO2: 23 mmol/L (ref 20–29)
Calcium: 9.7 mg/dL (ref 8.7–10.2)
Chloride: 104 mmol/L (ref 96–106)
Creatinine, Ser: 1.06 mg/dL (ref 0.76–1.27)
Globulin, Total: 2.5 g/dL (ref 1.5–4.5)
Glucose: 87 mg/dL (ref 70–99)
Potassium: 4.7 mmol/L (ref 3.5–5.2)
Sodium: 140 mmol/L (ref 134–144)
Total Protein: 6.8 g/dL (ref 6.0–8.5)
eGFR: 87 mL/min/{1.73_m2} (ref >59)

## 2023-02-17 LAB — LIPID PANEL
Chol/HDL Ratio: 2.3 {ratio} (ref 0.0–5.0)
Cholesterol, Total: 159 mg/dL (ref 100–199)
HDL: 69 mg/dL (ref >39)
LDL Chol Calc (NIH): 74 mg/dL (ref 0–99)
Triglycerides: 89 mg/dL (ref 0–149)
VLDL Cholesterol Cal: 16 mg/dL (ref 5–40)

## 2023-02-17 NOTE — Telephone Encounter (Signed)
-----   Message from Box sent at 02/17/2023  8:48 AM EST ----- Your cholesterol is normal.  Your kidneys, liver, blood sugar, and electrolytes are all normal.  Follow up as planned.  Thanks, Georgian Co, PA-C

## 2023-02-17 NOTE — Telephone Encounter (Signed)
Patient viewed results and Doctor comment through [facility]

## 2023-06-16 ENCOUNTER — Telehealth: Payer: Self-pay | Admitting: Internal Medicine

## 2023-06-16 ENCOUNTER — Encounter: Payer: Self-pay | Admitting: Internal Medicine

## 2023-06-16 ENCOUNTER — Ambulatory Visit: Payer: Medicaid Other | Attending: Internal Medicine | Admitting: Internal Medicine

## 2023-06-16 VITALS — BP 118/77 | HR 63 | Temp 97.8°F | Ht 69.0 in | Wt 166.0 lb

## 2023-06-16 DIAGNOSIS — F39 Unspecified mood [affective] disorder: Secondary | ICD-10-CM | POA: Insufficient documentation

## 2023-06-16 DIAGNOSIS — F17210 Nicotine dependence, cigarettes, uncomplicated: Secondary | ICD-10-CM | POA: Insufficient documentation

## 2023-06-16 DIAGNOSIS — Z716 Tobacco abuse counseling: Secondary | ICD-10-CM | POA: Insufficient documentation

## 2023-06-16 DIAGNOSIS — I10 Essential (primary) hypertension: Secondary | ICD-10-CM | POA: Diagnosis present

## 2023-06-16 DIAGNOSIS — E538 Deficiency of other specified B group vitamins: Secondary | ICD-10-CM | POA: Insufficient documentation

## 2023-06-16 DIAGNOSIS — Z8673 Personal history of transient ischemic attack (TIA), and cerebral infarction without residual deficits: Secondary | ICD-10-CM | POA: Diagnosis not present

## 2023-06-16 DIAGNOSIS — R7303 Prediabetes: Secondary | ICD-10-CM | POA: Diagnosis not present

## 2023-06-16 DIAGNOSIS — M25512 Pain in left shoulder: Secondary | ICD-10-CM | POA: Insufficient documentation

## 2023-06-16 DIAGNOSIS — M545 Low back pain, unspecified: Secondary | ICD-10-CM | POA: Diagnosis not present

## 2023-06-16 DIAGNOSIS — M25511 Pain in right shoulder: Secondary | ICD-10-CM | POA: Insufficient documentation

## 2023-06-16 DIAGNOSIS — Z79899 Other long term (current) drug therapy: Secondary | ICD-10-CM | POA: Diagnosis not present

## 2023-06-16 DIAGNOSIS — E785 Hyperlipidemia, unspecified: Secondary | ICD-10-CM | POA: Diagnosis not present

## 2023-06-16 DIAGNOSIS — F17200 Nicotine dependence, unspecified, uncomplicated: Secondary | ICD-10-CM

## 2023-06-16 DIAGNOSIS — R42 Dizziness and giddiness: Secondary | ICD-10-CM | POA: Insufficient documentation

## 2023-06-16 LAB — GLUCOSE, POCT (MANUAL RESULT ENTRY): POC Glucose: 98 mg/dL (ref 70–99)

## 2023-06-16 LAB — POCT GLYCOSYLATED HEMOGLOBIN (HGB A1C): HbA1c, POC (prediabetic range): 5.5 % — AB (ref 5.7–6.4)

## 2023-06-16 MED ORDER — SERTRALINE HCL 50 MG PO TABS: 50.0000 mg | ORAL_TABLET | Freq: Every day | ORAL | 1 refills | Status: AC

## 2023-06-16 MED ORDER — LISINOPRIL 20 MG PO TABS: ORAL_TABLET | Freq: Every day | ORAL | 1 refills | Status: DC

## 2023-06-16 MED ORDER — ATORVASTATIN CALCIUM 80 MG PO TABS: ORAL_TABLET | Freq: Every day | ORAL | 1 refills | Status: DC

## 2023-06-16 MED ORDER — CARVEDILOL 6.25 MG PO TABS: 3.1250 mg | ORAL_TABLET | Freq: Two times a day (BID) | ORAL | 1 refills | Status: DC

## 2023-06-16 MED ORDER — AMLODIPINE BESYLATE 10 MG PO TABS: ORAL_TABLET | ORAL | 1 refills | Status: AC

## 2023-06-16 NOTE — Telephone Encounter (Signed)
 Patient seen for appointment. All concerns addressed.

## 2023-06-16 NOTE — Patient Instructions (Signed)
 VISIT SUMMARY:  Today, you were seen for lower back and shoulder pain, dizziness, and a review of your ongoing health conditions including hypertension, hyperlipidemia, and vitamin B12 deficiency. We discussed your symptoms, current medications, and lifestyle habits.  YOUR PLAN:  -HYPERTENSION: Your blood pressure is well-controlled with your current medications. Continue taking amlodipine  10 mg daily, carvedilol  6.25 mg 1/2 tablet twice daily, and lisinopril  20 mg daily. Please monitor your blood pressure regularly.  -DIZZINESS: Your dizziness when standing up is likely due to your blood pressure medications. To help with this, change positions slowly and drink 4-8 glasses of water daily. We will also check your blood count to rule out anemia.  -HYPERLIPIDEMIA: Your cholesterol levels are well-managed with atorvastatin . Continue taking atorvastatin  80 mg daily.  -STROKE PREVENTION: You are taking aspirin  to prevent stroke, and there are no new neurological symptoms. Continue taking aspirin  as you have been.  -DEPRESSION: Your mood is stable with Zoloft . Continue taking Zoloft  as prescribed.  -VITAMIN B12 DEFICIENCY: You are taking vitamin B12 500 mcg daily. We will check your vitamin B12 levels to ensure they are within the normal range.  -NICOTINE  DEPENDENCE: You are currently smoking one pack of cigarettes every four days. While you are not ready to quit, we encourage you to consider smoking cessation when you feel ready.  -BACK PAIN: Your lower back pain is likely due to physical activity and is relieved by Tylenol . Use over-the-counter Icy Hot rub and a heating pad as needed, and avoid overexertion. Continue using Tylenol  as needed.  -SHOULDER PAIN: Your shoulder pain is likely due to physical activity. Use Tylenol  as needed and avoid overexertion.  INSTRUCTIONS:  Please schedule a follow-up appointment in 4-5 months or sooner if new issues arise. We will also check your blood count  and vitamin B12 levels.

## 2023-06-16 NOTE — Telephone Encounter (Signed)
 FYI; Noted.  Copied from CRM [id number]. Topic: General - Running Late >> Jun 16, 2023 10:32 AM Donald Frost wrote:  Merle Starcher the spouse of the patient called in to let the provider know the patient is running a few minutes behind. He is walking over from [facility] and should be in by 10:35

## 2023-06-16 NOTE — Progress Notes (Signed)
 Patient ID: Victor Davies, male    DOB: [birth year]  MRN: [id number]  CC: Hypertension (HTN f/u.  Med refill. /Lower back pain X 2 weeks/L & R shoulder pain X 2 - 3 weeks)   Subjective: Victor Davies is a 48 y.o. male who presents for chronic ds management. Teenage son is with him. His concerns today include:  Patient with history of HTN, HL,  tob dep, CVA., marijuana use, ETOH use disorder    Discussed the use of AI scribe software for clinical note transcription with the patient, who gave verbal consent to proceed.  History of Present Illness Victor Davies "Victor Davies" is a 48 year old male with hypertension and hyperlipidemia who presents with lower back and shoulder pain.  He experiences lower back pain for three weeks, located in the center of the upper lumbar region. The pain is intermittent, lasting about two hours, and does not radiate. There is no associated numbness, tingling, or incontinence. He suspects yard work, specifically pushing a lawnmower, may contribute to the pain.  Bilateral shoulder pain is present for the same duration as the back pain. It is intermittent, lasting about two hours, and worsens with movement. Tylenol  taken once daily as needed provides relief but he does not have to take the tylenol  every day.  HTN: Dizziness occurs when bending over and standing up, lasting one to two minutes, particularly during gardening. He is on amlodipine , carvedilol , and lisinopril  for hypertension. Blood pressure is checked infrequently but is generally good.  He tries to limit salt in the foods.  Denies any chest pains, shortness of breath or swelling in the legs.  Tobacco dependence: He smokes one pack of cigarettes every four days and is not ready to quit.  HL/history of CVA: He takes atorvastatin  for hyperlipidemia, aspirin ; he gets the aspirin  from over-the-counter.  vitamin B12 deficiency: Reports taking vitamin B12 500 mcg daily from over-the-counter.  It has  been over a year since he has had B12 level checked.    History of prediabetes: Recent A1c is 5.5.   He is on Zoloft  for a well-controlled mood disorder.    Patient Active Problem List   Diagnosis Date Noted   Hx of adenomatous colonic polyps 08/02/2022   Alcohol use disorder, moderate, dependence (HCC) 03/29/2022   Prediabetes 11/17/2020   Tinea versicolor 11/17/2020   Vitamin B12 deficiency 11/17/2020   Mood disorder (HCC) 07/08/2020   Insomnia 07/08/2020   History of CVA (cerebrovascular accident)    Marijuana abuse    Essential hypertension 05/10/2019   Tobacco dependence 05/10/2019   Opacity of lung on imaging study 05/10/2019     Current Outpatient Medications on File Prior to Visit  Medication Sig Dispense Refill   amLODipine  (NORVASC ) 10 MG tablet TAKE 1 TABLET BY MOUTH ONCE DAILY 90 tablet 1   aspirin  EC 325 MG EC tablet Take 1 tablet (325 mg total) by mouth daily. 30 tablet 0   atorvastatin  (LIPITOR ) 80 MG tablet TAKE 1 TABLET (80 MG TOTAL) BY MOUTH DAILY. 90 tablet 1   carvedilol  (COREG ) 6.25 MG tablet Take 0.5 tablets (3.125 mg total) by mouth 2 (two) times daily with a meal. 180 tablet 1   Cyanocobalamin (VITAMIN B 12) 500 MCG TABS Take 500 mcg by mouth daily.     diclofenac  Sodium (VOLTAREN ) 1 % GEL Apply 2 g topically 2 (two) times daily as needed. 100 g 1   ketoconazole  (NIZORAL ) 2 % cream Apply 1 application topically daily. Apply to  rash around neck 15 g 0   lisinopril  (ZESTRIL ) 20 MG tablet Take 1 tablet by mouth daily. 90 tablet 1   naltrexone  (DEPADE) 50 MG tablet Take 1 tablet (50 mg total) by mouth daily. 30 tablet 3   sertraline  (ZOLOFT ) 50 MG tablet Take 1 tablet (50 mg total) by mouth daily. 90 tablet 1   No current facility-administered medications on file prior to visit.    No Known Allergies  Social History   Socioeconomic History   Marital status: Married    Spouse name: Victor Davies    Number of children: 2   Years of education: Not on file    Highest education level: 11th grade  Occupational History   Occupation: unemployed  Tobacco Use   Smoking status: Every Day    Current packs/day: 0.25    Average packs/day: 0.3 packs/day for 20.0 years (5.0 ttl pk-yrs)    Types: Cigarettes   Smokeless tobacco: Never  Vaping Use   Vaping status: Never Used  Substance and Sexual Activity   Alcohol use: Yes    Alcohol/week: 14.0 - 21.0 standard drinks of alcohol    Types: 14 - 21 Cans of beer per week   Drug use: Not Currently   Sexual activity: Not on file  Other Topics Concern   Not on file  Social History Narrative   Not on file   Social Drivers of Health   Financial Resource Strain: Low Risk  (02/16/2023)   Overall Financial Resource Strain (CARDIA)    Difficulty of Paying Living Expenses: Not very hard  Food Insecurity: Food Insecurity Present (06/16/2023)   Hunger Vital Sign    Worried About Running Out of Food in the Last Year: Sometimes true    Ran Out of Food in the Last Year: Sometimes true  Transportation Needs: No Transportation Needs (02/16/2023)   PRAPARE - Administrator, Civil Service (Medical): No    Lack of Transportation (Non-Medical): No  Physical Activity: Unknown (02/16/2023)   Exercise Vital Sign    Days of Exercise per Week: 0 days    Minutes of Exercise per Session: Not on file  Stress: Stress Concern Present (02/16/2023)   [employer] of Occupational Health - Occupational Stress Questionnaire    Feeling of Stress : Rather much  Social Connections: Socially Isolated (02/16/2023)   Social Connection and Isolation Panel [NHANES]    Frequency of Communication with Friends and Family: Once a week    Frequency of Social Gatherings with Friends and Family: Never    Attends Religious Services: Never    Database administrator or Organizations: No    Attends Engineer, structural: Not on file    Marital Status: Married  Catering manager Violence: Not on file    Family History   Problem Relation Age of Onset   Arthritis Mother    Hypertension Father    Colon cancer Neg Hx    Colon polyps Neg Hx    Esophageal cancer Neg Hx    Rectal cancer Neg Hx    Stomach cancer Neg Hx     No past surgical history on file.  ROS: Review of Systems Negative except as stated above  PHYSICAL EXAM: BP 118/77 (BP Location: Left Arm, Patient Position: Sitting, Cuff Size: Normal)   Pulse 63   Temp 97.8 F (36.6 C) (Oral)   Ht 5\' 9"  (1.753 m)   Wt 166 lb (75.3 kg)   SpO2 99%   BMI 24.51 kg/m  Wt Readings from Last 3 Encounters:  06/16/23 166 lb (75.3 kg)  02/16/23 165 lb 6.4 oz (75 kg)  08/02/22 169 lb (76.7 kg)  Sitting: BP 121/77, P61 Standing: BP 130/86, P67  Physical Exam General appearance - alert, well appearing, middle-age male and in no distress Mental status - normal mood, behavior, speech, dress, motor activity, and thought processes Eyes -pink conjunctiva Mouth -oral mucosa is moist Chest - clear to auscultation, no wheezes, rales or rhonchi, symmetric air entry Heart - normal rate, regular rhythm, normal S1, S2, no murmurs, rubs, clicks or gallops Musculoskeletal -shoulders: No point tenderness along the joint lines.  Drop arm test negative.  Good range of motion without appearance of discomfort. Lumbar: No tenderness on palpation of the lumbar spine or surrounding paraspinal muscles.  Straight leg raise negative on both sides. Extremities -no lower extremity edema     Latest Ref Rng & Units 02/16/2023   11:40 AM 08/02/2022   10:07 AM 03/29/2022    9:38 AM  CMP  Glucose 70 - 99 mg/dL 87   91   BUN 6 - 24 mg/dL 12   9   Creatinine 1.61 - 1.27 mg/dL 0.96   0.45   Sodium 409 - 144 mmol/L 140   139   Potassium 3.5 - 5.2 mmol/L 4.7   5.0   Chloride 96 - 106 mmol/L 104   100   CO2 20 - 29 mmol/L 23   22   Calcium  8.7 - 10.2 mg/dL 9.7   9.6   Total Protein 6.0 - 8.5 g/dL 6.8  7.2  7.8   Total Bilirubin 0.0 - 1.2 mg/dL 0.4  0.5  0.4   Alkaline Phos 44 -  121 IU/L 61  79  89   AST 0 - 40 IU/L 16  21  18    ALT 0 - 44 IU/L 13  13  11     Lipid Panel     Component Value Date/Time   CHOL 159 02/16/2023 1140   TRIG 89 02/16/2023 1140   HDL 69 02/16/2023 1140   CHOLHDL 2.3 02/16/2023 1140   CHOLHDL 2.8 05/17/2019 0101   VLDL 5 05/17/2019 0101   LDLCALC 74 02/16/2023 1140    CBC    Component Value Date/Time   WBC 7.1 03/29/2022 0938   WBC 7.9 05/19/2019 0435   RBC 4.96 03/29/2022 0938   RBC 5.47 05/19/2019 0435   HGB 14.8 03/29/2022 0938   HCT 44.4 03/29/2022 0938   PLT 399 03/29/2022 0938   MCV 90 03/29/2022 0938   MCH 29.8 03/29/2022 0938   MCH 30.5 05/19/2019 0435   MCHC 33.3 03/29/2022 0938   MCHC 33.5 05/19/2019 0435   RDW 13.0 03/29/2022 0938   LYMPHSABS 2.5 05/17/2019 0617   MONOABS 0.6 05/17/2019 0617   EOSABS 0.2 05/17/2019 0617   BASOSABS 0.0 05/17/2019 0617   Results for orders placed or performed in visit on 06/16/23  POCT glucose (manual entry)   Collection Time: 06/16/23 11:08 AM  Result Value Ref Range   POC Glucose 98 70 - 99 mg/dl  POCT glycosylated hemoglobin (Hb A1C)   Collection Time: 06/16/23 11:47 AM  Result Value Ref Range   Hemoglobin A1C     HbA1c POC (<> result, manual entry)     HbA1c, POC (prediabetic range) 5.5 (A) 5.7 - 6.4 %   HbA1c, POC (controlled diabetic range)       ASSESSMENT AND PLAN: 1. Essential hypertension (Primary) At goal.  Continue  amlodipine , lisinopril  and carvedilol  - amLODipine  (NORVASC ) 10 MG tablet; TAKE 1 TABLET BY MOUTH ONCE DAILY  Dispense: 90 tablet; Refill: 1 - lisinopril  (ZESTRIL ) 20 MG tablet; Take 1 tablet by mouth daily.  Dispense: 90 tablet; Refill: 1 - carvedilol  (COREG ) 6.25 MG tablet; Take 0.5 tablets (3.125 mg total) by mouth 2 (two) times daily with a meal.  Dispense: 90 tablet; Refill: 1  2. Dyslipidemia Continue atorvastatin  high-dose given history of CVA.  Recent LFTs done earlier this year were normal. - atorvastatin  (LIPITOR ) 80 MG tablet; TAKE  1 TABLET (80 MG TOTAL) BY MOUTH DAILY.  Dispense: 90 tablet; Refill: 1  3. Mood disorder (HCC) Stable on Zoloft . - sertraline  (ZOLOFT ) 50 MG tablet; Take 1 tablet (50 mg total) by mouth daily.  Dispense: 90 tablet; Refill: 1  4. Prediabetes No longer in prediabetes range.  Encourage healthy eating habits especially getting in fruits and vegetables every day and eating lean meat - POCT glycosylated hemoglobin (Hb A1C) - POCT glucose (manual entry)  5. Vitamin B12 deficiency Continue vitamin B12 500 mcg over-the-counter daily - Vitamin B12  6. Tobacco dependence Strongly advised to quit.  Patient not ready to give a trial of quitting.  7. Dizziness Advised to go slow with position changes and avoid bending over in his garden.  Advised to use a stool to sit on instead - CBC  8. Intermittent low back pain Reports midline back pain in the lumbar region for the past 2 to 3 weeks that is intermittent.  Could be strain.  Recommend use of a heating pad and IcyHot rub as needed.  Continue Tylenol  as needed.  9. Acute pain of both shoulders Intermittent shoulder pain likely due to physical activity. Good range of motion. - Use Tylenol  as needed. - Advise to avoid overexertion.  Patient was given the opportunity to ask questions.  Patient verbalized understanding of the plan and was able to repeat key elements of the plan.   This documentation was completed using Paediatric nurse.  Any transcriptional errors are unintentional.  Orders Placed This Encounter  Procedures   POCT glycosylated hemoglobin (Hb A1C)   POCT glucose (manual entry)     Requested Prescriptions   Pending Prescriptions Disp Refills   amLODipine  (NORVASC ) 10 MG tablet 90 tablet 1    Sig: TAKE 1 TABLET BY MOUTH ONCE DAILY   atorvastatin  (LIPITOR ) 80 MG tablet 90 tablet 1    Sig: TAKE 1 TABLET (80 MG TOTAL) BY MOUTH DAILY.   sertraline  (ZOLOFT ) 50 MG tablet 90 tablet 1    Sig: Take 1 tablet (50  mg total) by mouth daily.   lisinopril  (ZESTRIL ) 20 MG tablet 90 tablet 1    Sig: Take 1 tablet by mouth daily.   carvedilol  (COREG ) 6.25 MG tablet 180 tablet 1    Sig: Take 0.5 tablets (3.125 mg total) by mouth 2 (two) times daily with a meal.    No follow-ups on file.  Concetta Dee, MD, FACP

## 2023-06-17 ENCOUNTER — Ambulatory Visit: Payer: Self-pay | Admitting: Internal Medicine

## 2023-06-17 LAB — CBC
Hematocrit: 42.9 % (ref 37.5–51.0)
Hemoglobin: 14.2 g/dL (ref 13.0–17.7)
MCH: 30.7 pg (ref 26.6–33.0)
MCHC: 33.1 g/dL (ref 31.5–35.7)
MCV: 93 fL (ref 79–97)
Platelets: 346 10*3/uL (ref 150–450)
RBC: 4.63 x10E6/uL (ref 4.14–5.80)
RDW: 13 % (ref 11.6–15.4)
WBC: 9.8 10*3/uL (ref 3.4–10.8)

## 2023-06-17 LAB — VITAMIN B12: Vitamin B-12: 920 pg/mL (ref 232–1245)

## 2023-11-17 ENCOUNTER — Ambulatory Visit: Attending: Internal Medicine | Admitting: Internal Medicine

## 2023-11-17 VITALS — BP 97/63 | HR 72 | Temp 97.8°F | Ht 69.0 in | Wt 165.0 lb

## 2023-11-17 DIAGNOSIS — Z8673 Personal history of transient ischemic attack (TIA), and cerebral infarction without residual deficits: Secondary | ICD-10-CM | POA: Insufficient documentation

## 2023-11-17 DIAGNOSIS — Z79899 Other long term (current) drug therapy: Secondary | ICD-10-CM | POA: Diagnosis not present

## 2023-11-17 DIAGNOSIS — F17210 Nicotine dependence, cigarettes, uncomplicated: Secondary | ICD-10-CM | POA: Diagnosis not present

## 2023-11-17 DIAGNOSIS — E538 Deficiency of other specified B group vitamins: Secondary | ICD-10-CM | POA: Insufficient documentation

## 2023-11-17 DIAGNOSIS — Z716 Tobacco abuse counseling: Secondary | ICD-10-CM | POA: Diagnosis not present

## 2023-11-17 DIAGNOSIS — I10 Essential (primary) hypertension: Secondary | ICD-10-CM | POA: Insufficient documentation

## 2023-11-17 DIAGNOSIS — F1090 Alcohol use, unspecified, uncomplicated: Secondary | ICD-10-CM | POA: Diagnosis not present

## 2023-11-17 DIAGNOSIS — Z23 Encounter for immunization: Secondary | ICD-10-CM | POA: Insufficient documentation

## 2023-11-17 DIAGNOSIS — E785 Hyperlipidemia, unspecified: Secondary | ICD-10-CM | POA: Insufficient documentation

## 2023-11-17 DIAGNOSIS — F1010 Alcohol abuse, uncomplicated: Secondary | ICD-10-CM | POA: Diagnosis not present

## 2023-11-17 DIAGNOSIS — F17200 Nicotine dependence, unspecified, uncomplicated: Secondary | ICD-10-CM

## 2023-11-17 DIAGNOSIS — F1290 Cannabis use, unspecified, uncomplicated: Secondary | ICD-10-CM | POA: Diagnosis not present

## 2023-11-17 MED ORDER — NICOTINE 21 MG/24HR TD PT24: 21.0000 mg | MEDICATED_PATCH | Freq: Every day | TRANSDERMAL | 1 refills | Status: AC

## 2023-11-17 MED ORDER — NALTREXONE HCL 50 MG PO TABS: 50.0000 mg | ORAL_TABLET | Freq: Every day | ORAL | 1 refills | Status: AC

## 2023-11-17 MED ORDER — LISINOPRIL 20 MG PO TABS: ORAL_TABLET | Freq: Every day | ORAL | 1 refills | Status: AC

## 2023-11-17 MED ORDER — ATORVASTATIN CALCIUM 80 MG PO TABS: ORAL_TABLET | Freq: Every day | ORAL | 1 refills | Status: AC

## 2023-11-17 NOTE — Patient Instructions (Addendum)
  VISIT SUMMARY: Today, you came in for your six-month follow-up appointment. We reviewed your current medications and discussed your blood pressure, cholesterol, alcohol use, smoking habits, vitamin B12 levels, and immunizations. We made some adjustments to your medications and provided guidance on lifestyle changes.  YOUR PLAN: -ESSENTIAL HYPERTENSION: Essential hypertension means high blood pressure without a known secondary cause. Your blood pressure was low today at 94/58 mmHg, and you experienced dizziness when standing up. We will discontinue carvedilol  and recheck your blood pressure. No refills for carvedilol  will be sent to the pharmacy.  -HYPERLIPIDEMIA WITH CEREBROVASCULAR ACCIDENT: Hyperlipidemia means high levels of fats (like cholesterol) in your blood, which can increase the risk of stroke. You should continue taking atorvastatin  80 mg daily and aspirin  daily. We will also order blood tests to check your cholesterol, kidney, and liver function.  -ALCOHOL DEPENDENCE: Alcohol dependence means having a strong craving for alcohol and difficulty controlling its use. You have reduced your alcohol intake to one small beer daily and aim to reduce it further. We will refill your naltrexone  prescription with one additional refill for two months. Try to reduce your alcohol intake to one beer three times a week with the goal of quitting completely.  -TOBACCO USE DISORDER: Tobacco use disorder means having a dependence on tobacco products. You are currently smoking one pack per week and want to quit. We will prescribe nicotine  patches to help you quit smoking. If the patches are not covered by Medicaid, you can contact 1-800-QUIT-NOW for assistance.  -VITAMIN B12 DEFICIENCY: Vitamin B12 deficiency means having low levels of vitamin B12, which is important for nerve function and making red blood cells. Your B12 level was previously checked at 900, which is good. Continue taking vitamin B12 500 mcg  daily.  -IMMUNIZATION: You are due for your third hepatitis B vaccine and received your flu shot today. We will administer the third hepatitis B vaccine today.  INSTRUCTIONS: Please follow up for a blood pressure recheck and the blood tests for cholesterol, kidney, and liver function. Continue with your current medications and lifestyle changes as discussed. If you have any questions or concerns, do not hesitate to contact our office.                      Contains text generated by [employer]bridge.                                 Contains text generated by [employer]bridge.

## 2023-11-17 NOTE — Progress Notes (Signed)
 Patient ID: Victor Davies, male    DOB: [birth year]  MRN: [id number]  CC: Hypertension (HTN f/u. Med refills. /No questions / concerns/Flu vax administered on 11/17/2023 - C.A.)   Subjective: Victor Davies is a 48 y.o. male who presents for chronic ds management. His concerns today include:  Patient with history of HTN, HL,  tob dep, CVA., marijuana use, ETOH use disorder    Discussed the use of AI scribe software for clinical note transcription with the patient, who gave verbal consent to proceed.  History of Present Illness Victor Davies is a 48 year old male who presents for a six-month follow-up.  HTN: He is on amlodipine  10 mg daily, lisinopril  20 mg daily, and carvedilol  6.25 mg half a tablet twice a day for hypertension. He experiences occasional dizziness when transitioning from sitting to standing. BP noted to be low today in the office. No chest pain or shortness of breath. Home blood pressure readings are occasionally 122/80 mmHg.  HL/hx CVA: He takes atorvastatin  80 mg daily for hyperlipidemia and aspirin  daily, with good compliance.   He has a history of vitamin B12 deficiency and takes over-the-counter vitamin B12 500 mcg daily. His last B12 level was 900.  He smokes approximately one pack of cigarettes per week and is attempting to quit. He has previously used nicotine  patches and is interested in using them again.   ETOH UD: He drinks one small beer daily and is working on reducing his alcohol intake even more. Desires to quit completely. He has used naltrexone  in the past for alcohol cravings and requests a refill. No on any narcotic meds  HM: He has received two of the three hepatitis B vaccines and is due for the third dose. Yes to flu shot    Patient Active Problem List   Diagnosis Date Noted   Hx of adenomatous colonic polyps 08/02/2022   Alcohol use disorder, moderate, dependence (HCC) 03/29/2022   Prediabetes 11/17/2020   Tinea versicolor  11/17/2020   Vitamin B12 deficiency 11/17/2020   Mood disorder 07/08/2020   Insomnia 07/08/2020   History of CVA (cerebrovascular accident)    Marijuana abuse    Essential hypertension 05/10/2019   Tobacco dependence 05/10/2019   Opacity of lung on imaging study 05/10/2019     Current Outpatient Medications on File Prior to Visit  Medication Sig Dispense Refill   amLODipine  (NORVASC ) 10 MG tablet TAKE 1 TABLET BY MOUTH ONCE DAILY 90 tablet 1   aspirin  EC 325 MG EC tablet Take 1 tablet (325 mg total) by mouth daily. 30 tablet 0   Cyanocobalamin (VITAMIN B 12) 500 MCG TABS Take 1,000 mcg by mouth daily. Patient is taking 1000 mcg by mouth daily     diclofenac  Sodium (VOLTAREN ) 1 % GEL Apply 2 g topically 2 (two) times daily as needed. 100 g 1   folic acid  (FOLVITE ) 800 MCG tablet Take 1,000 mcg by mouth daily. Patient is taking 1000 mcg by mouth daily     sertraline  (ZOLOFT ) 50 MG tablet Take 1 tablet (50 mg total) by mouth daily. 90 tablet 1   No current facility-administered medications on file prior to visit.    No Known Allergies  Social History   Socioeconomic History   Marital status: Married    Spouse name: Margretta    Number of children: 2   Years of education: Not on file   Highest education level: 11th grade  Occupational History   Occupation: unemployed  Tobacco  Use   Smoking status: Every Day    Current packs/day: 0.25    Average packs/day: 0.3 packs/day for 20.0 years (5.0 ttl pk-yrs)    Types: Cigarettes   Smokeless tobacco: Never  Vaping Use   Vaping status: Never Used  Substance and Sexual Activity   Alcohol use: Yes    Alcohol/week: 14.0 - 21.0 standard drinks of alcohol    Types: 14 - 21 Cans of beer per week   Drug use: Not Currently   Sexual activity: Not on file  Other Topics Concern   Not on file  Social History Narrative   Not on file   Social Drivers of Health   Financial Resource Strain: Low Risk  (11/17/2023)   Overall Financial Resource  Strain (CARDIA)    Difficulty of Paying Living Expenses: Not very hard  Food Insecurity: No Food Insecurity (11/17/2023)   Hunger Vital Sign    Worried About Running Out of Food in the Last Year: Never true    Ran Out of Food in the Last Year: Never true  Transportation Needs: No Transportation Needs (11/17/2023)   PRAPARE - Administrator, Civil Service (Medical): No    Lack of Transportation (Non-Medical): No  Physical Activity: Insufficiently Active (11/17/2023)   Exercise Vital Sign    Days of Exercise per Week: 3 days    Minutes of Exercise per Session: 10 min  Stress: Stress Concern Present (11/17/2023)   [facility] [employer] - Occupational Stress Questionnaire    Feeling of Stress: To some extent  Social Connections: Socially Isolated (11/17/2023)   Social Connection and Isolation Panel    Frequency of Communication with Friends and Family: Once a week    Frequency of Social Gatherings with Friends and Family: Never    Attends Religious Services: Never    Database administrator or Organizations: No    Attends Banker Meetings: Never    Marital Status: Married  Catering manager Violence: Not At Risk (11/17/2023)   Humiliation, Afraid, Rape, and Kick questionnaire    Fear of Current or Ex-Partner: No    Emotionally Abused: No    Physically Abused: No    Sexually Abused: No    Family History  Problem Relation Age of Onset   Arthritis Mother    Hypertension Father    Colon cancer Neg Hx    Colon polyps Neg Hx    Esophageal cancer Neg Hx    Rectal cancer Neg Hx    Stomach cancer Neg Hx     No past surgical history on file.  ROS: Review of Systems Negative except as stated above  PHYSICAL EXAM: BP 97/63   Pulse 72   Temp 97.8 F (36.6 C) (Oral)   Ht 5' 9 (1.753 m)   Wt 165 lb (74.8 kg)   SpO2 97%   BMI 24.37 kg/m   Physical Exam  General appearance - alert, well appearing, middle ageand in no distress Mental  status - normal mood, behavior, speech, dress, motor activity, and thought processes Neck - supple, no significant adenopathy Chest - clear to auscultation, no wheezes, rales or rhonchi, symmetric air entry Heart - normal rate, regular rhythm, normal S1, S2, no murmurs, rubs, clicks or gallops Extremities - peripheral pulses normal, no pedal edema, no clubbing or cyanosis     Latest Ref Rng & Units 02/16/2023   11:40 AM 08/02/2022   10:07 AM 03/29/2022    9:38 AM  CMP  Glucose 70 - 99 mg/dL 87   91   BUN 6 - 24 mg/dL 12   9   Creatinine 9.23 - 1.27 mg/dL 8.93   8.96   Sodium 865 - 144 mmol/L 140   139   Potassium 3.5 - 5.2 mmol/L 4.7   5.0   Chloride 96 - 106 mmol/L 104   100   CO2 20 - 29 mmol/L 23   22   Calcium  8.7 - 10.2 mg/dL 9.7   9.6   Total Protein 6.0 - 8.5 g/dL 6.8  7.2  7.8   Total Bilirubin 0.0 - 1.2 mg/dL 0.4  0.5  0.4   Alkaline Phos 44 - 121 IU/L 61  79  89   AST 0 - 40 IU/L 16  21  18    ALT 0 - 44 IU/L 13  13  11     Lipid Panel     Component Value Date/Time   CHOL 159 02/16/2023 1140   TRIG 89 02/16/2023 1140   HDL 69 02/16/2023 1140   CHOLHDL 2.3 02/16/2023 1140   CHOLHDL 2.8 05/17/2019 0101   VLDL 5 05/17/2019 0101   LDLCALC 74 02/16/2023 1140    CBC    Component Value Date/Time   WBC 9.8 06/16/2023 1152   WBC 7.9 05/19/2019 0435   RBC 4.63 06/16/2023 1152   RBC 5.47 05/19/2019 0435   HGB 14.2 06/16/2023 1152   HCT 42.9 06/16/2023 1152   PLT 346 06/16/2023 1152   MCV 93 06/16/2023 1152   MCH 30.7 06/16/2023 1152   MCH 30.5 05/19/2019 0435   MCHC 33.1 06/16/2023 1152   MCHC 33.5 05/19/2019 0435   RDW 13.0 06/16/2023 1152   LYMPHSABS 2.5 05/17/2019 0617   MONOABS 0.6 05/17/2019 0617   EOSABS 0.2 05/17/2019 0617   BASOSABS 0.0 05/17/2019 0617    ASSESSMENT AND PLAN: 1. Essential hypertension (Primary) BP low today even on repeat. D/C Coreg . Continue Norvasc  10 mg and Lisinopril  20 mg. Continue to monitor home BP with goal <130/80 - lisinopril   (ZESTRIL ) 20 MG tablet; Take 1 tablet by mouth daily.  Dispense: 90 tablet; Refill: 1 - Comprehensive metabolic panel with GFR  2. Tobacco dependence Advised to quit completely.  Patient has cut down and desires to quit.  He is wanting to try the nicotine  patches again.  Refill sent. Reassess on subsequent visit - nicotine  (NICODERM CQ  - DOSED IN MG/24 HOURS) 21 mg/24hr patch; Place 1 patch (21 mg total) onto the skin daily.  Dispense: 28 patch; Refill: 1  3. Alcohol use disorder Alcohol consumption reduced to one small beer daily. Desires further reduction. Previously prescribed naltrexone  for cravings and request RF. Strongly advise to quit completely. - Refill naltrexone  with one additional refill  - naltrexone  (DEPADE) 50 MG tablet; Take 1 tablet (50 mg total) by mouth daily.  Dispense: 30 tablet; Refill: 1  4. Dyslipidemia Continue Lipitor  - atorvastatin  (LIPITOR ) 80 MG tablet; TAKE 1 TABLET (80 MG TOTAL) BY MOUTH DAILY.  Dispense: 90 tablet; Refill: 1 - Lipid panel  5. Vitamin B12 deficiency B12 level previously checked at 900. Continues vitamin B12 supplementation. - Continue vitamin B12 500 mcg daily.  6. Need for immunization against influenza - Flu vaccine trivalent PF, 6mos and older(Flulaval,Afluria,Fluarix,Fluzone)  7. Need for vaccination against hepatitis B virus 3rd shot given today  Patient was given the opportunity to ask questions.  Patient verbalized understanding of the plan and was able to repeat key elements of the plan.  This documentation was completed using Paediatric nurse.  Any transcriptional errors are unintentional.  Orders Placed This Encounter  Procedures   Flu vaccine trivalent PF, 6mos and older(Flulaval,Afluria,Fluarix,Fluzone)   Comprehensive metabolic panel with GFR   Lipid panel     Requested Prescriptions   Signed Prescriptions Disp Refills   naltrexone  (DEPADE) 50 MG tablet 30 tablet 1    Sig: Take 1 tablet (50 mg  total) by mouth daily.   atorvastatin  (LIPITOR ) 80 MG tablet 90 tablet 1    Sig: TAKE 1 TABLET (80 MG TOTAL) BY MOUTH DAILY.   lisinopril  (ZESTRIL ) 20 MG tablet 90 tablet 1    Sig: Take 1 tablet by mouth daily.   nicotine  (NICODERM CQ  - DOSED IN MG/24 HOURS) 21 mg/24hr patch 28 patch 1    Sig: Place 1 patch (21 mg total) onto the skin daily.    Return in about 6 months (around 05/17/2024).  Barnie Louder, MD, FACP

## 2023-11-18 ENCOUNTER — Ambulatory Visit: Payer: Self-pay | Admitting: Internal Medicine

## 2023-11-18 LAB — COMPREHENSIVE METABOLIC PANEL WITH GFR
ALT: 13 IU/L (ref 0–44)
AST: 18 IU/L (ref 0–40)
Albumin: 4.5 g/dL (ref 4.1–5.1)
Alkaline Phosphatase: 54 IU/L (ref 47–123)
BUN/Creatinine Ratio: 20 (ref 9–20)
BUN: 21 mg/dL (ref 6–24)
Bilirubin Total: 0.5 mg/dL (ref 0.0–1.2)
CO2: 23 mmol/L (ref 20–29)
Calcium: 9.4 mg/dL (ref 8.7–10.2)
Chloride: 101 mmol/L (ref 96–106)
Creatinine, Ser: 1.03 mg/dL (ref 0.76–1.27)
Globulin, Total: 2.9 g/dL (ref 1.5–4.5)
Glucose: 83 mg/dL (ref 70–99)
Potassium: 4.8 mmol/L (ref 3.5–5.2)
Sodium: 137 mmol/L (ref 134–144)
Total Protein: 7.4 g/dL (ref 6.0–8.5)
eGFR: 89 mL/min/1.73 (ref >59)

## 2023-11-18 LAB — LIPID PANEL
Chol/HDL Ratio: 2.8 ratio (ref 0.0–5.0)
Cholesterol, Total: 165 mg/dL (ref 100–199)
HDL: 60 mg/dL (ref >39)
LDL Chol Calc (NIH): 87 mg/dL (ref 0–99)
Triglycerides: 102 mg/dL (ref 0–149)
VLDL Cholesterol Cal: 18 mg/dL (ref 5–40)

## 2024-05-17 ENCOUNTER — Ambulatory Visit: Admitting: Internal Medicine
